# Patient Record
Sex: Male | Born: 1965 | Race: White | Hispanic: No | Marital: Married | Smoking: Former smoker
Health system: Southern US, Community
[De-identification: ages and names within clinical notes are randomized; demographics above are authoritative.]

## PROBLEM LIST (undated history)

## (undated) DIAGNOSIS — D649 Anemia, unspecified: Secondary | ICD-10-CM

## (undated) HISTORY — DX: Anemia, unspecified: D64.9

---

## 2007-12-11 ENCOUNTER — Ambulatory Visit: Payer: Self-pay | Admitting: Gastroenterology

## 2010-01-25 ENCOUNTER — Ambulatory Visit: Payer: Self-pay | Admitting: Family Medicine

## 2014-10-14 ENCOUNTER — Emergency Department: Payer: Self-pay | Admitting: Emergency Medicine

## 2014-10-14 LAB — CBC WITH DIFFERENTIAL/PLATELET
BASOS PCT: 1.3 %
Basophil #: 0.1 10*3/uL (ref 0.0–0.1)
Eosinophil #: 0.2 10*3/uL (ref 0.0–0.7)
Eosinophil %: 2.8 %
HCT: 35.6 % — ABNORMAL LOW (ref 40.0–52.0)
HGB: 10.2 g/dL — AB (ref 13.0–18.0)
LYMPHS ABS: 1.4 10*3/uL (ref 1.0–3.6)
Lymphocyte %: 15.9 %
MCH: 18 pg — AB (ref 26.0–34.0)
MCHC: 28.6 g/dL — AB (ref 32.0–36.0)
MCV: 63 fL — ABNORMAL LOW (ref 80–100)
MONO ABS: 0.6 x10 3/mm (ref 0.2–1.0)
MONOS PCT: 6.9 %
NEUTROS PCT: 73.1 %
Neutrophil #: 6.3 10*3/uL (ref 1.4–6.5)
Platelet: 278 10*3/uL (ref 150–440)
RBC: 5.63 10*6/uL (ref 4.40–5.90)
RDW: 21.2 % — ABNORMAL HIGH (ref 11.5–14.5)
WBC: 8.6 10*3/uL (ref 3.8–10.6)

## 2014-10-14 LAB — TROPONIN I

## 2014-10-15 LAB — COMPREHENSIVE METABOLIC PANEL
ALK PHOS: 55 U/L
ALT: 24 U/L
Albumin: 3.6 g/dL (ref 3.4–5.0)
Anion Gap: 8 (ref 7–16)
BILIRUBIN TOTAL: 0.6 mg/dL (ref 0.2–1.0)
BUN: 12 mg/dL (ref 7–18)
CALCIUM: 8.1 mg/dL — AB (ref 8.5–10.1)
CREATININE: 1.01 mg/dL (ref 0.60–1.30)
Chloride: 108 mmol/L — ABNORMAL HIGH (ref 98–107)
Co2: 25 mmol/L (ref 21–32)
EGFR (African American): >60
EGFR (Non-African Amer.): >60
GLUCOSE: 103 mg/dL — AB (ref 65–99)
Osmolality: 281 (ref 275–301)
Potassium: 3.8 mmol/L (ref 3.5–5.1)
SGOT(AST): 20 U/L (ref 15–37)
Sodium: 141 mmol/L (ref 136–145)
Total Protein: 7.1 g/dL (ref 6.4–8.2)

## 2014-12-30 ENCOUNTER — Ambulatory Visit: Payer: Self-pay | Admitting: Gastroenterology

## 2015-03-30 LAB — SURGICAL PATHOLOGY

## 2017-04-14 ENCOUNTER — Telehealth: Payer: Self-pay | Admitting: *Deleted

## 2017-04-14 NOTE — Telephone Encounter (Signed)
Received referral for lung screening scan. Contacted patient and reviewed criteria for being considered high risk. Patient is no longer smoking. Will contact when turns 51 years old.

## 2017-05-02 ENCOUNTER — Encounter (INDEPENDENT_AMBULATORY_CARE_PROVIDER_SITE_OTHER): Payer: Self-pay

## 2017-05-02 ENCOUNTER — Ambulatory Visit: Payer: BLUE CROSS/BLUE SHIELD

## 2017-05-02 ENCOUNTER — Inpatient Hospital Stay: Payer: BLUE CROSS/BLUE SHIELD | Attending: Oncology | Admitting: Oncology

## 2017-05-02 ENCOUNTER — Inpatient Hospital Stay: Payer: BLUE CROSS/BLUE SHIELD

## 2017-05-02 ENCOUNTER — Encounter: Payer: Self-pay | Admitting: Oncology

## 2017-05-02 VITALS — BP 134/88 | HR 65

## 2017-05-02 VITALS — BP 129/83 | HR 71 | Temp 97.4°F | Ht 68.0 in | Wt 193.0 lb

## 2017-05-02 DIAGNOSIS — D751 Secondary polycythemia: Secondary | ICD-10-CM | POA: Diagnosis not present

## 2017-05-02 DIAGNOSIS — R5381 Other malaise: Secondary | ICD-10-CM | POA: Insufficient documentation

## 2017-05-02 DIAGNOSIS — Z801 Family history of malignant neoplasm of trachea, bronchus and lung: Secondary | ICD-10-CM | POA: Diagnosis not present

## 2017-05-02 DIAGNOSIS — Z87891 Personal history of nicotine dependence: Secondary | ICD-10-CM | POA: Insufficient documentation

## 2017-05-02 DIAGNOSIS — E291 Testicular hypofunction: Secondary | ICD-10-CM | POA: Diagnosis not present

## 2017-05-02 DIAGNOSIS — Z79899 Other long term (current) drug therapy: Secondary | ICD-10-CM

## 2017-05-02 DIAGNOSIS — R5383 Other fatigue: Secondary | ICD-10-CM | POA: Diagnosis not present

## 2017-05-02 LAB — COMPREHENSIVE METABOLIC PANEL
ALBUMIN: 4.3 g/dL (ref 3.5–5.0)
ALK PHOS: 54 U/L (ref 38–126)
ALT: 29 U/L (ref 17–63)
AST: 23 U/L (ref 15–41)
Anion gap: 6 (ref 5–15)
BUN: 19 mg/dL (ref 6–20)
CALCIUM: 9.5 mg/dL (ref 8.9–10.3)
CO2: 24 mmol/L (ref 22–32)
CREATININE: 0.99 mg/dL (ref 0.61–1.24)
Chloride: 105 mmol/L (ref 101–111)
GFR calc Af Amer: >60 mL/min (ref >60)
GFR calc non Af Amer: >60 mL/min (ref >60)
GLUCOSE: 100 mg/dL — AB (ref 65–99)
Potassium: 4.2 mmol/L (ref 3.5–5.1)
SODIUM: 135 mmol/L (ref 135–145)
Total Bilirubin: 1.2 mg/dL (ref 0.3–1.2)
Total Protein: 7.2 g/dL (ref 6.5–8.1)

## 2017-05-02 LAB — CBC WITH DIFFERENTIAL/PLATELET
BASOS PCT: 0 %
Basophils Absolute: 0 10*3/uL (ref 0–0.1)
EOS ABS: 0.3 10*3/uL (ref 0–0.7)
Eosinophils Relative: 4 %
HEMATOCRIT: 50.1 % (ref 40.0–52.0)
HEMOGLOBIN: 17.6 g/dL (ref 13.0–18.0)
LYMPHS ABS: 1.6 10*3/uL (ref 1.0–3.6)
Lymphocytes Relative: 19 %
MCH: 32.7 pg (ref 26.0–34.0)
MCHC: 35.2 g/dL (ref 32.0–36.0)
MCV: 92.9 fL (ref 80.0–100.0)
Monocytes Absolute: 0.5 10*3/uL (ref 0.2–1.0)
Monocytes Relative: 7 %
NEUTROS ABS: 5.7 10*3/uL (ref 1.4–6.5)
NEUTROS PCT: 70 %
Platelets: 248 10*3/uL (ref 150–440)
RBC: 5.39 MIL/uL (ref 4.40–5.90)
RDW: 12 % (ref 11.5–14.5)
WBC: 8.2 10*3/uL (ref 3.8–10.6)

## 2017-05-02 LAB — URINALYSIS, COMPLETE (UACMP) WITH MICROSCOPIC
BACTERIA UA: NONE SEEN
Bilirubin Urine: NEGATIVE
Glucose, UA: NEGATIVE mg/dL
Hgb urine dipstick: NEGATIVE
Ketones, ur: NEGATIVE mg/dL
Leukocytes, UA: NEGATIVE
Nitrite: NEGATIVE
PH: 7 (ref 5.0–8.0)
Protein, ur: NEGATIVE mg/dL
SPECIFIC GRAVITY, URINE: 1.02 (ref 1.005–1.030)
SQUAMOUS EPITHELIAL / LPF: NONE SEEN

## 2017-05-02 NOTE — Progress Notes (Addendum)
Hematology/Oncology Consult note [facility] [phone] [phone]  Patient Care Team: Dortha KernBliss, Laura K, MD as PCP - General (Family Medicine)   Name of the patient: Mark Riddle  [id number]  01/18/1966    Reason for referral- polycythemia   Referring physician- Dr. Joen LauraLaura Bliss  Date of visit: 05/02/17   History of presenting illness- patient is a 51 year old male with a history of hypogonadism who has been on testosterone since last 2-3 years. Currently he is off testosterone for 1.5 months.  He has been referred to us for elevated hemoglobin. Most recent labs from 04/13/2017 showed a mildly elevated total bilirubin of 1.7 with normal LFTs. BMP were within normal limits. CBC showed white count of 6.5, H&H of 19.5/50.4 and a platelet count of 219. Iron studies showed B12 of 1068. Folate was greater than 20. Iron saturation was 47% and serum iron was normal at 153. Prior CBC from May 2016 showed H&H of 17.7/51.7 in December 2015 his H&H was 12.2/41.7 with an MCV of 72   The patient smoked for 30 years but quit smoking about 10 years ago. He has not been evaluated for obstructive sleep apnea. He does report a restful sleep but wakes up once or twice a night to go to the bathroom. Denies any loss of appetite or unintentional weight loss   ECOG PS- 0  Pain scale- 0   Review of systems- Review of Systems  Constitutional: Positive for malaise/fatigue. Negative for chills, fever and weight loss.  HENT: Negative for congestion, ear discharge and nosebleeds.   Eyes: Negative for blurred vision.  Respiratory: Negative for cough, hemoptysis, sputum production, shortness of breath and wheezing.   Cardiovascular: Negative for chest pain, palpitations, orthopnea and claudication.  Gastrointestinal: Negative for abdominal pain, blood in stool, constipation, diarrhea, heartburn, melena, nausea and vomiting.  Genitourinary: Negative for dysuria, flank pain,  frequency, hematuria and urgency.  Musculoskeletal: Negative for back pain, joint pain and myalgias.  Skin: Negative for rash.  Neurological: Negative for dizziness, tingling, focal weakness, seizures, weakness and headaches.  Endo/Heme/Allergies: Does not bruise/bleed easily.  Psychiatric/Behavioral: Negative for depression and suicidal ideas. The patient does not have insomnia.     No Known Allergies  There are no active problems to display for this patient.    Past Medical History:  Diagnosis Date  . Anemia      History reviewed. No pertinent surgical history.  Social History   Social History  . Marital status: Married    Spouse name: N/A  . Number of children: N/A  . Years of education: N/A   Occupational History  . Not on file.   Social History Main Topics  . Smoking status: Former Smoker    Packs/day: 1.00    Quit date: 05/03/2007  . Smokeless tobacco: Never Used  . Alcohol use Yes     Comment: occasional  . Drug use: No  . Sexual activity: Not on file   Other Topics Concern  . Not on file   Social History Narrative  . No narrative on file     Family History  Problem Relation Age of Onset  . Pulmonary fibrosis Maternal Uncle   . Pulmonary fibrosis Paternal Aunt   . Diabetes Paternal Grandmother   . Cancer Paternal Grandfather        lung     Current Outpatient Prescriptions:  .  omeprazole (PRILOSEC) 40 MG capsule, Take by mouth., Disp: , Rfl:    Physical exam:  Vitals:   05/02/17 1154  BP: 129/83  Pulse: 71  Temp: 97.4 F (36.3 C)  TempSrc: Tympanic  Weight: 193 lb (87.5 kg)  Height: 5\' 8"  (1.727 m)   Physical Exam  Constitutional: He is oriented to person, place, and time and well-developed, well-nourished, and in no distress.  Face appears plethoric  HENT:  Head: Normocephalic and atraumatic.  Eyes: EOM are normal. Pupils are equal, round, and reactive to light.  Neck: Normal range of motion.  Cardiovascular: Normal rate,  regular rhythm and normal heart sounds.   Pulmonary/Chest: Effort normal and breath sounds normal.  Abdominal: Soft. Bowel sounds are normal.  No palpable splenomegaly  Neurological: He is alert and oriented to person, place, and time.  Skin: Skin is warm and dry.       CMP Latest Ref Rng & Units 10/15/2014  Glucose 65 - 99 mg/dL 161(W)  BUN 7 - 18 mg/dL 12  Creatinine 9.60 - 4.54 mg/dL 0.98  Sodium 119 - 147 mmol/L 141  Potassium 3.5 - 5.1 mmol/L 3.8  Chloride 98 - 107 mmol/L 108(H)  CO2 21 - 32 mmol/L 25  Calcium 8.5 - 10.1 mg/dL 8.1(L)  Total Protein 6.4 - 8.2 g/dL 7.1  Total Bilirubin 0.2 - 1.0 mg/dL 0.6  Alkaline Phos Unit/L 55  AST 15 - 37 Unit/L 20  ALT U/L 24   CBC Latest Ref Rng & Units 05/02/2017  WBC 3.8 - 10.6 K/uL 8.2  Hemoglobin 13.0 - 18.0 g/dL 82.9  Hematocrit 56.2 - 52.0 % 50.1  Platelets 150 - 440 K/uL 248     Assessment and plan- Patient is a 51 y.o. male referred to [place] for polycythemia  Polycythemia- explained what is primary versus secondary polycythemia. 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 I would like to r/o primary polycythemia vera however. Testosterone induced polycythemia resolves after stopping testosterone supplements. I would recommend that if patient needs testosterone, he needs to eb started on the lowest possible dose only to keep his hematocriit and hemoglobin within normal range  Today I will check cbc with diff, epo level, jak2 testing with reflex to exon 12, CXR, UA and serum testosetrone level. Will arrange for phlebotomy today and Qweekly until hematocrit <50 until we ascertain the cause.   I will see him back in 4 weeks time  Thank you for this kind referral and the opportunity to participate in the care of this patient   Visit Diagnosis 1. Polycythemia, secondary     Dr. Owens Shark, MD, MPH [facility] at [facility] Pager-  [phone] 05/02/2017    Addendum: repeat cbc today showed hb of 17.1 hct 50.1. He can go for phlebotomy today but we will hold off on future phlebotomies  Dr. Owens Shark, MD, MPH [facility] at [facility] Pager- [phone] 05/02/2017 12:50 PM

## 2017-05-02 NOTE — Patient Instructions (Signed)

## 2017-05-02 NOTE — Progress Notes (Signed)
Patient here for initial visit. He has no significant medica/ surgical history. No complaints of pain or discomfort, appetite good, sleep pattern normal. Patient was recently taken off testosterone injections due to elevated HGB.

## 2017-05-03 LAB — TESTOSTERONE: Testosterone: 191 ng/dL — ABNORMAL LOW (ref 264–916)

## 2017-05-08 LAB — JAK2 EXONS 12-15

## 2017-05-09 ENCOUNTER — Inpatient Hospital Stay: Payer: BLUE CROSS/BLUE SHIELD | Attending: Oncology

## 2017-05-09 ENCOUNTER — Inpatient Hospital Stay: Payer: BLUE CROSS/BLUE SHIELD

## 2017-05-09 DIAGNOSIS — E291 Testicular hypofunction: Secondary | ICD-10-CM | POA: Insufficient documentation

## 2017-05-09 DIAGNOSIS — D751 Secondary polycythemia: Secondary | ICD-10-CM | POA: Insufficient documentation

## 2017-05-09 DIAGNOSIS — R5383 Other fatigue: Secondary | ICD-10-CM | POA: Insufficient documentation

## 2017-05-09 DIAGNOSIS — Z87891 Personal history of nicotine dependence: Secondary | ICD-10-CM | POA: Insufficient documentation

## 2017-05-09 DIAGNOSIS — R531 Weakness: Secondary | ICD-10-CM | POA: Insufficient documentation

## 2017-05-16 ENCOUNTER — Inpatient Hospital Stay: Payer: BLUE CROSS/BLUE SHIELD

## 2017-05-22 ENCOUNTER — Ambulatory Visit
Admission: RE | Admit: 2017-05-22 | Discharge: 2017-05-22 | Disposition: A | Discharge status: Home / Self Care | Payer: BLUE CROSS/BLUE SHIELD | Source: Ambulatory Visit | Attending: Oncology | Admitting: Oncology

## 2017-05-22 DIAGNOSIS — D751 Secondary polycythemia: Secondary | ICD-10-CM

## 2017-05-23 ENCOUNTER — Inpatient Hospital Stay: Payer: BLUE CROSS/BLUE SHIELD

## 2017-05-29 ENCOUNTER — Other Ambulatory Visit: Payer: Self-pay | Admitting: *Deleted

## 2017-05-29 DIAGNOSIS — D751 Secondary polycythemia: Secondary | ICD-10-CM

## 2017-05-30 ENCOUNTER — Other Ambulatory Visit: Payer: Self-pay | Admitting: *Deleted

## 2017-05-30 ENCOUNTER — Inpatient Hospital Stay (HOSPITAL_BASED_OUTPATIENT_CLINIC_OR_DEPARTMENT_OTHER): Payer: BLUE CROSS/BLUE SHIELD | Admitting: Oncology

## 2017-05-30 ENCOUNTER — Inpatient Hospital Stay: Payer: BLUE CROSS/BLUE SHIELD

## 2017-05-30 ENCOUNTER — Telehealth: Payer: Self-pay | Admitting: *Deleted

## 2017-05-30 ENCOUNTER — Encounter: Payer: Self-pay | Admitting: Oncology

## 2017-05-30 ENCOUNTER — Other Ambulatory Visit: Payer: Self-pay | Admitting: Oncology

## 2017-05-30 VITALS — BP 145/71 | HR 71 | Temp 97.5°F | Resp 18 | Wt 194.4 lb

## 2017-05-30 DIAGNOSIS — Z87891 Personal history of nicotine dependence: Secondary | ICD-10-CM

## 2017-05-30 DIAGNOSIS — D751 Secondary polycythemia: Secondary | ICD-10-CM

## 2017-05-30 DIAGNOSIS — E291 Testicular hypofunction: Secondary | ICD-10-CM

## 2017-05-30 DIAGNOSIS — R5383 Other fatigue: Secondary | ICD-10-CM

## 2017-05-30 DIAGNOSIS — R531 Weakness: Secondary | ICD-10-CM | POA: Diagnosis not present

## 2017-05-30 LAB — CBC
HEMATOCRIT: 45.4 % (ref 40.0–52.0)
HEMOGLOBIN: 16.1 g/dL (ref 13.0–18.0)
MCH: 32.8 pg (ref 26.0–34.0)
MCHC: 35.5 g/dL (ref 32.0–36.0)
MCV: 92.5 fL (ref 80.0–100.0)
Platelets: 210 10*3/uL (ref 150–440)
RBC: 4.9 MIL/uL (ref 4.40–5.90)
RDW: 12.2 % (ref 11.5–14.5)
WBC: 7.7 10*3/uL (ref 3.8–10.6)

## 2017-05-30 NOTE — Telephone Encounter (Signed)
Called the practice and was explaining the pt's hct is below 50 but it is due to him not being on testosterone.  We will see him back in 3months.  Dr. Smith Robertao was told by pt that he will probably be starting back on testosterone low dose soon.  Since we are not seeing him for 3 months could they check a monthly cbc if they start him back on testosterone only. If the cbc results show hct over 50 then call us and we can set him up for phlebotomy.  The office wanted me to send this in a fax and I have sent it with why and when and what we need to be called about

## 2017-05-30 NOTE — Progress Notes (Signed)
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 Currently he is off testosterone for 1.5 months.  He has been referred to us for elevated hemoglobin. Most recent labs from 04/13/2017 showed a mildly elevated total bilirubin of 1.7 with normal LFTs. BMP were within normal limits. CBC showed white count of 6.5, H&H of 19.5/50.4 and a platelet count of 219. Iron studies showed B12 of 1068. Folate was greater than 20. Iron saturation was 47% and serum iron was normal at 153. Prior CBC from May 2016 showed H&H of 17.7/51.7 in December 2015 his H&H was 12.2/41.7 with an MCV of 72   The patient smoked for 30 years but quit smoking about 10 years ago. He has not been evaluated for obstructive sleep apnea. He does report a restful sleep but wakes up once or twice a night to go to the bathroom. Denies any loss of appetite or unintentional weight loss   Further blood work from 05/02/2017 was as follows: CBC showed white count of 8.2, H&H of 17.6/50.1 and a platelet count of 248. CMP was within normal limits. Jak 2 exon 12 mutation testing was negative. Urinalysis was negative for hematuria.   Interval history- Patient remains off testosterone and does report fatigue because of that  ECOG PS- 1 Pain scale- 0   Review of systems- Review of Systems  Constitutional: Positive for malaise/fatigue. Negative for chills, fever and weight loss.  HENT: Negative for congestion, ear discharge and nosebleeds.   Eyes: Negative for  blurred vision.  Respiratory: Negative for cough, hemoptysis, sputum production, shortness of breath and wheezing.   Cardiovascular: Negative for chest pain, palpitations, orthopnea and claudication.  Gastrointestinal: Negative for abdominal pain, blood in stool, constipation, diarrhea, heartburn, melena, nausea and vomiting.  Genitourinary: Negative for dysuria, flank pain, frequency, hematuria and urgency.  Musculoskeletal: Negative for back pain, joint pain and myalgias.  Skin: Negative for rash.  Neurological: Negative for dizziness, tingling, focal weakness, seizures, weakness and headaches.  Endo/Heme/Allergies: Does not bruise/bleed easily.  Psychiatric/Behavioral: Negative for depression and suicidal ideas. The patient does not have insomnia.        No Known Allergies   Past Medical History:  Diagnosis Date  . Anemia      No past surgical history on file.  Social History   Social History  . Marital status: Married    Spouse name: N/A  . Number of children: N/A  . Years of education: N/A   Occupational History  . Not on file.   Social History Main Topics  . Smoking status: Former Smoker    Packs/day: 1.00    Quit date: 05/03/2007  . Smokeless tobacco: Never Used  . Alcohol use Yes     Comment: occasional  . Drug use: No  . Sexual activity: Not on file   Other Topics Concern  . Not on file   Social History Narrative  . No narrative on file    Family History  Problem Relation Age of Onset  . Pulmonary fibrosis Maternal Uncle   . Pulmonary  fibrosis Paternal Aunt   . Diabetes Paternal Grandmother   . Cancer Paternal Grandfather        lung     Current Outpatient Prescriptions:  .  omeprazole (PRILOSEC) 40 MG capsule, Take by mouth., Disp: , Rfl:   Physical exam:  Vitals:   05/30/17 1417  BP: (!) 145/71  Pulse: 71  Resp: 18  Temp: 97.5 F (36.4 C)  TempSrc: Tympanic  Weight: 194 lb 7 oz (88.2 kg)   Physical Exam  Constitutional: He is  oriented to person, place, and time and well-developed, well-nourished, and in no distress.  HENT:  Head: Normocephalic and atraumatic.  Eyes: EOM are normal. Pupils are equal, round, and reactive to light.  Neck: Normal range of motion.  Cardiovascular: Normal rate, regular rhythm and normal heart sounds.   Pulmonary/Chest: Effort normal and breath sounds normal.  Abdominal: Soft. Bowel sounds are normal.  Neurological: He is alert and oriented to person, place, and time.  Skin: Skin is warm and dry.     CMP Latest Ref Rng & Units 05/02/2017  Glucose 65 - 99 mg/dL 161(W)  BUN 6 - 20 mg/dL 19  Creatinine 9.60 - 4.54 mg/dL 0.98  Sodium 119 - 147 mmol/L 135  Potassium 3.5 - 5.1 mmol/L 4.2  Chloride 101 - 111 mmol/L 105  CO2 22 - 32 mmol/L 24  Calcium 8.9 - 10.3 mg/dL 9.5  Total Protein 6.5 - 8.1 g/dL 7.2  Total Bilirubin 0.3 - 1.2 mg/dL 1.2  Alkaline Phos 38 - 126 U/L 54  AST 15 - 41 U/L 23  ALT 17 - 63 U/L 29   CBC Latest Ref Rng & Units 05/30/2017  WBC 3.8 - 10.6 K/uL 7.7  Hemoglobin 13.0 - 18.0 g/dL 82.9  Hematocrit 56.2 - 52.0 % 45.4  Platelets 150 - 440 K/uL 210    No images are attached to the encounter.  Dg Chest 2 View  Result Date: 05/22/2017 CLINICAL DATA:  Hypogonadism with elevated hemoglobin EXAM: CHEST  2 VIEW COMPARISON:  None. FINDINGS: The heart size and mediastinal contours are within normal limits. Both lungs are clear. The visualized skeletal structures are unremarkable. IMPRESSION: No active cardiopulmonary disease. Electronically Signed   By: Alcide Clever M.D.   On: 05/22/2017 16:01     Assessment and plan- Patient is a 51 y.o. male with secondary polycythemia likely secondary to testosterone replacement therapy  Patient received one session of phlebotomy during his last visit. Currently his H&H of 16.1/45.4 and he does not need a phlebotomy at this time. He has been off testosterone for about 2 months now. He can be restarted on testosterone at the  lowest possible dose which will alleviate his symptoms. Dr. Quillian Quince who has been giving him his testosterone would also need to check periodic CBCs every month and should his hematocrit is 50, our office needs to be contacted to restart phlebotomy at that time    I will see him back in 3 months with repeat cbc diff for possible phlebotomy  Visit Diagnosis 1. Polycythemia, secondary      Dr. Owens Shark, MD, MPH [facility] Pager- [phone] 05/30/2017 3:13 PM

## 2017-05-30 NOTE — Progress Notes (Signed)
Patient offers no complaints today. 

## 2017-08-31 ENCOUNTER — Inpatient Hospital Stay: Payer: BLUE CROSS/BLUE SHIELD

## 2017-08-31 ENCOUNTER — Other Ambulatory Visit: Payer: Self-pay | Admitting: *Deleted

## 2017-08-31 ENCOUNTER — Encounter: Payer: Self-pay | Admitting: Oncology

## 2017-08-31 ENCOUNTER — Inpatient Hospital Stay: Payer: BLUE CROSS/BLUE SHIELD | Attending: Oncology | Admitting: Oncology

## 2017-08-31 VITALS — BP 123/79 | HR 68 | Temp 98.7°F | Resp 14 | Ht 68.0 in | Wt 196.6 lb

## 2017-08-31 DIAGNOSIS — E291 Testicular hypofunction: Secondary | ICD-10-CM | POA: Diagnosis not present

## 2017-08-31 DIAGNOSIS — Z79899 Other long term (current) drug therapy: Secondary | ICD-10-CM | POA: Insufficient documentation

## 2017-08-31 DIAGNOSIS — R5381 Other malaise: Secondary | ICD-10-CM | POA: Insufficient documentation

## 2017-08-31 DIAGNOSIS — D751 Secondary polycythemia: Secondary | ICD-10-CM | POA: Diagnosis not present

## 2017-08-31 DIAGNOSIS — R5383 Other fatigue: Secondary | ICD-10-CM | POA: Diagnosis not present

## 2017-08-31 DIAGNOSIS — Z87891 Personal history of nicotine dependence: Secondary | ICD-10-CM

## 2017-08-31 DIAGNOSIS — R6882 Decreased libido: Secondary | ICD-10-CM | POA: Insufficient documentation

## 2017-08-31 LAB — CBC
HEMATOCRIT: 45.4 % (ref 40.0–52.0)
Hemoglobin: 16 g/dL (ref 13.0–18.0)
MCH: 33.6 pg (ref 26.0–34.0)
MCHC: 35.3 g/dL (ref 32.0–36.0)
MCV: 95.2 fL (ref 80.0–100.0)
PLATELETS: 222 10*3/uL (ref 150–440)
RBC: 4.77 MIL/uL (ref 4.40–5.90)
RDW: 11.9 % (ref 11.5–14.5)
WBC: 8.8 10*3/uL (ref 3.8–10.6)

## 2017-08-31 NOTE — Progress Notes (Signed)
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 Currently he is off testosterone for 1.5 months. He has been referred to [place] for elevated hemoglobin. Most recent labs from 04/13/2017 showed a mildly elevated total bilirubin of 1.7 with normal LFTs. BMPwere within normal limits. CBC showed white count of 6.5, H&H of 19.5/50.4 and a platelet count of 219. Iron studies showed B12 of 1068. Folate was greater than 20. Iron saturation was 47% and serum iron was normal at 153. Prior CBC from May 2016 showed H&H of 17.7/51.7 in December 2015 his H&H was 12.2/41.7 with an MCV of 72   The patient smoked for 30 years but quit smoking about 10 years ago. He has not been evaluated for obstructive sleep apnea. He does report a restful sleep but wakes up once or twice a night to go to the bathroom. Denies any loss of appetite or unintentional weight loss   Further blood work from 05/02/2017 was as follows: CBC showed white count of 8.2, H&H of 17.6/50.1 and a platelet count of 248. CMP was within normal limits. Jak 2 exon 12 mutation testing was negative. Urinalysis was negative for hematuria.  Interval history- reports feeling fatigued and low in energy and sex drive has been low  ECOG PS- 0 Pain scale- 0   Review of systems- Review of Systems  Constitutional: Positive for malaise/fatigue. Negative for chills, fever and weight loss.  HENT: Negative for congestion, ear discharge  and nosebleeds.   Eyes: Negative for blurred vision.  Respiratory: Negative for cough, hemoptysis, sputum production, shortness of breath and wheezing.   Cardiovascular: Negative for chest pain, palpitations, orthopnea and claudication.  Gastrointestinal: Negative for abdominal pain, blood in stool, constipation, diarrhea, heartburn, melena, nausea and vomiting.  Genitourinary: Negative for dysuria, flank pain, frequency, hematuria and urgency.  Musculoskeletal: Negative for back pain, joint pain and myalgias.  Skin: Negative for rash.  Neurological: Negative for dizziness, tingling, focal weakness, seizures, weakness and headaches.  Endo/Heme/Allergies: Does not bruise/bleed easily.  Psychiatric/Behavioral: Negative for depression and suicidal ideas. The patient does not have insomnia.      No Known Allergies   Past Medical History:  Diagnosis Date  . Anemia      No past surgical history on file.  Social History   Social History  . Marital status: Married    Spouse name: N/A  . Number of children: N/A  . Years of education: N/A   Occupational History  . Not on file.   Social History Main Topics  . Smoking status: Former Smoker    Packs/day: 1.00    Quit date: 05/03/2007  . Smokeless tobacco: Never Used  . Alcohol use Yes     Comment: occasional  . Drug use: No  . Sexual activity: Not on file   Other Topics Concern  . Not on file   Social History Narrative  . No narrative on file    Family History  Problem Relation Age of Onset  . Pulmonary fibrosis Maternal Uncle   .  Pulmonary fibrosis Paternal Aunt   . Diabetes Paternal Grandmother   . Cancer Paternal Grandfather        lung     Current Outpatient Prescriptions:  .  omeprazole (PRILOSEC) 40 MG capsule, Take by mouth., Disp: , Rfl:   Physical exam:  Vitals:   08/31/17 1420  BP: 123/79  Pulse: 68  Resp: 14  Temp: 98.7 F (37.1 C)  TempSrc: Tympanic  Weight: 196 lb 9.6 oz (89.2 kg)  Height: 5'  8" (1.727 m)   Physical Exam  Constitutional: He is oriented to person, place, and time and well-developed, well-nourished, and in no distress.  HENT:  Head: Normocephalic and atraumatic.  Eyes: Pupils are equal, round, and reactive to light. EOM are normal.  Neck: Normal range of motion.  Cardiovascular: Normal rate, regular rhythm and normal heart sounds.   Pulmonary/Chest: Effort normal and breath sounds normal.  Abdominal: Soft. Bowel sounds are normal.  Neurological: He is alert and oriented to person, place, and time.  Skin: Skin is warm and dry.     CMP Latest Ref Rng & Units 05/02/2017  Glucose 65 - 99 mg/dL 045(W)  BUN 6 - 20 mg/dL 19  Creatinine 0.98 - 1.19 mg/dL 1.47  Sodium 829 - 562 mmol/L 135  Potassium 3.5 - 5.1 mmol/L 4.2  Chloride 101 - 111 mmol/L 105  CO2 22 - 32 mmol/L 24  Calcium 8.9 - 10.3 mg/dL 9.5  Total Protein 6.5 - 8.1 g/dL 7.2  Total Bilirubin 0.3 - 1.2 mg/dL 1.2  Alkaline Phos 38 - 126 U/L 54  AST 15 - 41 U/L 23  ALT 17 - 63 U/L 29   CBC Latest Ref Rng & Units 08/31/2017  WBC 3.8 - 10.6 K/uL 8.8  Hemoglobin 13.0 - 18.0 g/dL 13.0  Hematocrit 86.5 - 52.0 % 45.4  Platelets 150 - 440 K/uL 222     Assessment and plan- Patient is a 51 y.o. 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 He is no longer polycythemic and does not require phelebotomy today. He reports more energy levels and better sex drive when he was on testosterone. I would therefore recomemnd that he can be restarted on testosetrone at a low dose and dose can be titrated slowly upwards to symptom relief while closely monitoring cbc monthly. If he has polycythemia with low doses of testosterone , I will consider phlebotomy at that time. JAK2 mutation testing from today is pending  RTC in 3 months with cbc diff for possible phlebotomy   Visit Diagnosis 1. Polycythemia, secondary      Dr. Owens Shark, MD, MPH [facility] Pager- [phone] 08/31/2017 2:20 PM

## 2017-08-31 NOTE — Progress Notes (Signed)
Patient has no complaints at this time.  

## 2017-09-04 LAB — JAK2 GENOTYPR

## 2017-09-07 ENCOUNTER — Emergency Department: Payer: Worker's Compensation

## 2017-09-07 ENCOUNTER — Emergency Department
Admission: EM | Admit: 2017-09-07 | Discharge: 2017-09-07 | Disposition: A | Discharge status: Home / Self Care | Payer: Worker's Compensation | Attending: Emergency Medicine | Admitting: Emergency Medicine

## 2017-09-07 ENCOUNTER — Encounter: Payer: Self-pay | Admitting: Emergency Medicine

## 2017-09-07 DIAGNOSIS — W319XXA Contact with unspecified machinery, initial encounter: Secondary | ICD-10-CM | POA: Insufficient documentation

## 2017-09-07 DIAGNOSIS — M20012 Mallet finger of left finger(s): Secondary | ICD-10-CM | POA: Diagnosis not present

## 2017-09-07 DIAGNOSIS — Y929 Unspecified place or not applicable: Secondary | ICD-10-CM | POA: Insufficient documentation

## 2017-09-07 DIAGNOSIS — Y990 Civilian activity done for income or pay: Secondary | ICD-10-CM | POA: Insufficient documentation

## 2017-09-07 DIAGNOSIS — Z79899 Other long term (current) drug therapy: Secondary | ICD-10-CM | POA: Insufficient documentation

## 2017-09-07 DIAGNOSIS — S92535A Nondisplaced fracture of distal phalanx of left lesser toe(s), initial encounter for closed fracture: Secondary | ICD-10-CM | POA: Insufficient documentation

## 2017-09-07 DIAGNOSIS — Z87891 Personal history of nicotine dependence: Secondary | ICD-10-CM | POA: Diagnosis not present

## 2017-09-07 DIAGNOSIS — Y9389 Activity, other specified: Secondary | ICD-10-CM | POA: Insufficient documentation

## 2017-09-07 DIAGNOSIS — S6992XA Unspecified injury of left wrist, hand and finger(s), initial encounter: Secondary | ICD-10-CM | POA: Diagnosis present

## 2017-09-07 DIAGNOSIS — S62639A Displaced fracture of distal phalanx of unspecified finger, initial encounter for closed fracture: Secondary | ICD-10-CM

## 2017-09-07 NOTE — ED Notes (Signed)
Radiology at bedside for x ray

## 2017-09-07 NOTE — ED Notes (Signed)
Pt was at work and got his left tip of his 5th digit in some selfing at work. He states that he has no pain at this time that it is numb. No laceration. Tip of 5th digit is slightly swollen. No deformity seen.

## 2017-09-07 NOTE — ED Notes (Signed)
Applied foam splint to left 5th digit. Secured with stretchy tape. Pulses present.

## 2017-09-07 NOTE — ED Provider Notes (Signed)
[facility] Emergency Department Provider Note  ____________________________________________  Time seen: Approximately 7:34 AM  I have reviewed the triage vital signs and the nursing notes.   HISTORY  Chief Complaint Finger Injury    HPI Mark Riddle is a 51 y.o. male and presents to emergency department for evaluation of left little finger injury. Patient states that he got his hand caught in a machine at work. He is having difficulty straightening finger. He is able to bend his finger without difficulty. No additional injuries.No numbness, tingling.   Past Medical History:  Diagnosis Date  . Anemia     Patient Active Problem List   Diagnosis Date Noted  . 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 No pertinent surgical history.  Prior to Admission medications   Medication Sig Start Date End Date Taking? Authorizing Provider  omeprazole (PRILOSEC) 40 MG capsule Take 40 mg by mouth daily.   Yes [provider]  QUEtiapine (SEROQUEL) 50 MG tablet Take 50 mg by mouth at bedtime.   Yes [provider]  omeprazole (PRILOSEC) 40 MG capsule Take by mouth.    [provider]    Allergies Patient has no known allergies.  Family History  Problem Relation Age of Onset  . Pulmonary fibrosis Maternal Uncle   . Pulmonary fibrosis Paternal Aunt   . Diabetes Paternal Grandmother   . Cancer Paternal Grandfather        lung    Social History Social History  Substance Use Topics  . Smoking status: Former Smoker    Packs/day: 1.00    Quit date: 05/03/2007  . Smokeless tobacco: Never Used  . Alcohol use Yes     Comment: occasional     Review of Systems  Constitutional: No fever/chills Cardiovascular: No chest pain. Respiratory: No SOB. Gastrointestinal: No abdominal pain.  No nausea, no vomiting.  Skin: Negative for rash, abrasions, lacerations,  ecchymosis.   ____________________________________________   PHYSICAL EXAM:  VITAL SIGNS: ED Triage Vitals  Enc Vitals Group     BP 09/07/17 0717 (!) 146/86     Pulse Rate 09/07/17 0717 76     Resp --      Temp 09/07/17 0717 98.1 F (36.7 C)     Temp Source 09/07/17 0717 Oral     SpO2 09/07/17 0717 94 %     Weight 09/07/17 0718 198 lb (89.8 kg)     Height 09/07/17 0718  (1.727 m)     Head Circumference --      Peak Flow --      Pain Score --      Pain Loc --      Pain Edu? --      Excl. in GC? --      Constitutional: Alert and oriented. Well appearing and in no acute distress. Eyes: Conjunctivae are normal. PERRL. EOMI. Head: Atraumatic. ENT:      Ears:      Nose: No congestion/rhinnorhea.      Mouth/Throat: Mucous membranes are moist.  Neck: No stridor.  Cardiovascular: Normal rate, regular rhythm.  Good peripheral circulation. Respiratory: Normal respiratory effort without tachypnea or retractions. Lungs CTAB. Good air entry to the bases with no decreased or absent breath sounds. Musculoskeletal: Full range of motion to all extremities. No gross deformities appreciated. Unable to achieve full passive extension at DIP joint of little left finger. Full strength with flexion of the left finger. Mild swelling of left fifth finger. Neurologic:  Normal  speech and language. No gross focal neurologic deficits are appreciated.  Skin:  Skin is warm, dry and intact. No rash noted.   ____________________________________________   LABS (all labs ordered are listed, but only abnormal results are displayed)  Labs Reviewed - No data to display ____________________________________________  EKG   ____________________________________________  RADIOLOGY Lexine Baton, personally viewed and evaluated these images (plain radiographs) as part of my medical decision making, as well as reviewing the written report by the radiologist.  Dg Finger Little Left  Result Date:  09/07/2017 CLINICAL DATA:  Crush injury involving the pinky finger. No previous injury. EXAM: LEFT LITTLE FINGER 2+V COMPARISON:  None. FINDINGS: Potential minimally displaced fracture involving the medial aspect of the tuft of the distal phalanx of the fifth digit. No definite intra-articular extension. Joint spaces appear preserved. No erosions. Regional soft tissues appear normal. No radiopaque foreign body. IMPRESSION: Potential tiny minimally displaced fracture involving medial aspect of the tuft of the distal phalanx of the fifth digit without intra-articular extension or radiopaque foreign body. Electronically Signed   By: Simonne Come M.D.   On: 09/07/2017 08:39    ____________________________________________    PROCEDURES  Procedure(s) performed:    Procedures    Medications - No data to display   ____________________________________________   INITIAL IMPRESSION / ASSESSMENT AND PLAN / ED COURSE  Pertinent labs & imaging results that were available during my care of the patient were reviewed by me and considered in my medical decision making (see chart for details).  Review of the [place] CSRS was performed in accordance of the [facility] prior to dispensing any controlled drugs.     Patient's diagnosis is consistent with mallet finger. Vital signs and exam are reassuring. X-ray indicates possibly minimally displaced fracture at distal phalanx. Up-to-date recommends that patient be placed in a splint in extension and follow-up with orthopedics. Splint was placed. Patient is to follow up with orthopedics as directed. Patient is given ED precautions to return to the ED for any worsening or new symptoms.     ____________________________________________  FINAL CLINICAL IMPRESSION(S) / ED DIAGNOSES  Final diagnoses:  Mallet finger of left hand  Closed fracture of tuft of distal phalanx of finger      NEW MEDICATIONS STARTED DURING THIS VISIT:  Discharge Medication List as of  09/07/2017  9:09 AM          This chart was dictated using voice recognition software/Dragon. Despite best efforts to proofread, errors can occur which can change the meaning. Any change was purely unintentional.    Enid Derry, PA-C 09/07/17 1037    Governor Rooks, MD 09/07/17 1328

## 2017-09-07 NOTE — ED Triage Notes (Signed)
Patient presents to ED via POV from work. Patient injured his left pinky finger. Patient states he got it caught between a metal crate at work and twisted it. Edema noted. Patient wishes to file WC. UDS required per WC profile.

## 2017-11-30 ENCOUNTER — Inpatient Hospital Stay: Payer: BLUE CROSS/BLUE SHIELD

## 2017-11-30 ENCOUNTER — Encounter: Payer: Self-pay | Admitting: Oncology

## 2017-11-30 ENCOUNTER — Inpatient Hospital Stay: Payer: BLUE CROSS/BLUE SHIELD | Attending: Oncology | Admitting: Oncology

## 2017-11-30 VITALS — BP 141/95 | HR 81 | Temp 97.7°F | Resp 16 | Wt 208.0 lb

## 2017-11-30 DIAGNOSIS — Z79899 Other long term (current) drug therapy: Secondary | ICD-10-CM | POA: Diagnosis not present

## 2017-11-30 DIAGNOSIS — Z87891 Personal history of nicotine dependence: Secondary | ICD-10-CM | POA: Insufficient documentation

## 2017-11-30 DIAGNOSIS — Z809 Family history of malignant neoplasm, unspecified: Secondary | ICD-10-CM | POA: Insufficient documentation

## 2017-11-30 DIAGNOSIS — E291 Testicular hypofunction: Secondary | ICD-10-CM | POA: Diagnosis not present

## 2017-11-30 DIAGNOSIS — R531 Weakness: Secondary | ICD-10-CM | POA: Diagnosis not present

## 2017-11-30 DIAGNOSIS — R351 Nocturia: Secondary | ICD-10-CM | POA: Insufficient documentation

## 2017-11-30 DIAGNOSIS — R5383 Other fatigue: Secondary | ICD-10-CM | POA: Insufficient documentation

## 2017-11-30 DIAGNOSIS — D751 Secondary polycythemia: Secondary | ICD-10-CM | POA: Diagnosis not present

## 2017-11-30 LAB — CBC
HEMATOCRIT: 50.4 % (ref 40.0–52.0)
HEMOGLOBIN: 17.3 g/dL (ref 13.0–18.0)
MCH: 32.9 pg (ref 26.0–34.0)
MCHC: 34.3 g/dL (ref 32.0–36.0)
MCV: 95.8 fL (ref 80.0–100.0)
Platelets: 225 10*3/uL (ref 150–440)
RBC: 5.26 MIL/uL (ref 4.40–5.90)
RDW: 12.8 % (ref 11.5–14.5)
WBC: 9.3 10*3/uL (ref 3.8–10.6)

## 2017-11-30 NOTE — Progress Notes (Signed)
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 Currently he is off testosterone for 1.5 months. He has been referred to us for elevated hemoglobin. Most recent labs from 04/13/2017 showed a mildly elevated total bilirubin of 1.7 with normal LFTs. BMPwere within normal limits. CBC showed white count of 6.5, H&H of 19.5/50.4 and a platelet count of 219. Iron studies showed B12 of 1068. Folate was greater than 20. Iron saturation was 47% and serum iron was normal at 153. Prior CBC from May 2016 showed H&H of 17.7/51.7 in December 2015 his H&H was 12.2/41.7 with an MCV of 72   The patient smoked for 30 years but quit smoking about 10 years ago. He has not been evaluated for obstructive sleep apnea. He does report a restful sleep but wakes up once or twice a night to go to the bathroom. Denies any loss of appetite or unintentional weight loss   Further blood work from 05/02/2017 was as follows: CBC showed white count of 8.2, H&H of 17.6/50.1 and a platelet count of 248. CMP was within normal limits. Jak 2 exon 12 mutation testing was negative. Urinalysis was negative for hematuria.  Jak 2 mutation testing was negative    Interval history-patient is currently on lower his dose of testosterone and says that he has been on 1 mL every 2 weeks as opposed to 2.5 mL in the past.  Has mild fatigue but denies other complaints of headaches, chest pain or dizziness  ECOG PS- 0 Pain scale- 0   Review of  systems- Review of Systems  Constitutional: Negative for chills, fever, malaise/fatigue and weight loss.  HENT: Negative for congestion, ear discharge and nosebleeds.   Eyes: Negative for blurred vision.  Respiratory: Negative for cough, hemoptysis, sputum production, shortness of breath and wheezing.   Cardiovascular: Negative for chest pain, palpitations, orthopnea and claudication.  Gastrointestinal: Negative for abdominal pain, blood in stool, constipation, diarrhea, heartburn, melena, nausea and vomiting.  Genitourinary: Negative for dysuria, flank pain, frequency, hematuria and urgency.  Musculoskeletal: Negative for back pain, joint pain and myalgias.  Skin: Negative for rash.  Neurological: Negative for dizziness, tingling, focal weakness, seizures, weakness and headaches.  Endo/Heme/Allergies: Does not bruise/bleed easily.  Psychiatric/Behavioral: Negative for depression and suicidal ideas. The patient does not have insomnia.       No Known Allergies   Past Medical History:  Diagnosis Date  . Anemia      No past surgical history on file.  Social History   Socioeconomic History  . Marital status: Married    Spouse name: Not on file  . Number of children: Not on file  . Years of education: Not on file  . Highest education level: Not on file  Social Needs  . Financial resource strain: Not on file  . Food insecurity - worry: Not on file  . Food insecurity - inability: Not on file  . Transportation needs - medical: Not on file  . Transportation needs - non-medical: Not on file  Occupational History  . Not on  file  Tobacco Use  . Smoking status: Former Smoker    Packs/day: 1.00    Last attempt to quit: 05/03/2007    Years since quitting: 10.5  . Smokeless tobacco: Never Used  Substance and Sexual Activity  . Alcohol use: Yes    Comment: occasional  . Drug use: No  . Sexual activity: Not on file  Other Topics Concern  . Not on file  Social History Narrative    . Not on file    Family History  Problem Relation Age of Onset  . Pulmonary fibrosis Maternal Uncle   . Pulmonary fibrosis Paternal Aunt   . Diabetes Paternal Grandmother   . Cancer Paternal Grandfather        lung     Current Outpatient Medications:  .  omeprazole (PRILOSEC) 40 MG capsule, Take by mouth., Disp: , Rfl:  .  omeprazole (PRILOSEC) 40 MG capsule, Take 40 mg by mouth daily., Disp: , Rfl:  .  QUEtiapine (SEROQUEL) 50 MG tablet, Take 50 mg by mouth at bedtime., Disp: , Rfl:   Physical exam:  Vitals:   11/30/17 1413  BP: (!) 141/95  Pulse: 81  Resp: 16  Temp: 97.7 F (36.5 C)  TempSrc: Tympanic  Weight: 208 lb (94.3 kg)   Physical Exam  Constitutional: He is oriented to person, place, and time and well-developed, well-nourished, and in no distress.  HENT:  Head: Normocephalic and atraumatic.  Eyes: EOM are normal. Pupils are equal, round, and reactive to light.  Neck: Normal range of motion.  Cardiovascular: Normal rate, regular rhythm and normal heart sounds.  Pulmonary/Chest: Effort normal and breath sounds normal.  Abdominal: Soft. Bowel sounds are normal.  Neurological: He is alert and oriented to person, place, and time.  Skin: Skin is warm and dry.     CMP Latest Ref Rng & Units 05/02/2017  Glucose 65 - 99 mg/dL 161(W)  BUN 6 - 20 mg/dL 19  Creatinine 9.60 - 4.54 mg/dL 0.98  Sodium 119 - 147 mmol/L 135  Potassium 3.5 - 5.1 mmol/L 4.2  Chloride 101 - 111 mmol/L 105  CO2 22 - 32 mmol/L 24  Calcium 8.9 - 10.3 mg/dL 9.5  Total Protein 6.5 - 8.1 g/dL 7.2  Total Bilirubin 0.3 - 1.2 mg/dL 1.2  Alkaline Phos 38 - 126 U/L 54  AST 15 - 41 U/L 23  ALT 17 - 63 U/L 29   CBC Latest Ref Rng & Units 11/30/2017  WBC 3.8 - 10.6 K/uL 9.3  Hemoglobin 13.0 - 18.0 g/dL 82.9  Hematocrit 56.2 - 52.0 % 50.4  Platelets 150 - 440 K/uL 225    Assessment and plan- Patient is a 51 y.o. male secondary polycythemia likely due to testosterone replacement  therapy  Recent testosterone levels checked on 11/02/2017 was 388.  Today's hematocrit is 50.4 and I will hold off on phlebotomy today and monitor his CBC closely.  Patient is currently back on testosterone at lowest possible doses.  I will repeat his CBC with differential monthly and if his hematocrit is any higher I will consider phlebotomy at that time.  I would also like him to get evaluated for obstructive sleep apnea to rule out any other cause of secondary polycythemia and I will touch base with Dr. Quillian Quince regarding this  rtc in 3 months with cbc with diff for possible phlebotomy   Visit Diagnosis 1. Polycythemia, secondary      Dr. Owens Shark, MD, MPH [facility] at [facility]  Pager- [phone] 11/30/2017 1:43 PM

## 2018-01-01 ENCOUNTER — Other Ambulatory Visit: Payer: Self-pay | Admitting: Oncology

## 2018-01-01 ENCOUNTER — Inpatient Hospital Stay: Payer: BLUE CROSS/BLUE SHIELD | Attending: Oncology

## 2018-01-01 ENCOUNTER — Inpatient Hospital Stay: Payer: BLUE CROSS/BLUE SHIELD

## 2018-01-01 DIAGNOSIS — D751 Secondary polycythemia: Secondary | ICD-10-CM

## 2018-01-01 LAB — CBC
HCT: 53.3 % — ABNORMAL HIGH (ref 40.0–52.0)
Hemoglobin: 18.3 g/dL — ABNORMAL HIGH (ref 13.0–18.0)
MCH: 32.4 pg (ref 26.0–34.0)
MCHC: 34.3 g/dL (ref 32.0–36.0)
MCV: 94.5 fL (ref 80.0–100.0)
PLATELETS: 245 10*3/uL (ref 150–440)
RBC: 5.64 MIL/uL (ref 4.40–5.90)
RDW: 12.5 % (ref 11.5–14.5)
WBC: 9 10*3/uL (ref 3.8–10.6)

## 2018-01-08 ENCOUNTER — Telehealth: Payer: Self-pay | Admitting: *Deleted

## 2018-01-08 NOTE — Telephone Encounter (Signed)
Called to tell pt that last week when he was here Sherri SearLelie had called Dr. Smith Robertao in [place] and it was suggested based o h/h that he needs to coe every 2 weeks and the next time would be 2/11 and he already has another scheduled for 2/28. He is acceptable for the one on 2/11. Lab 2 pm and poss phlebotomy 2:30/  I sent message to sch. To make appt.

## 2018-01-09 ENCOUNTER — Telehealth: Payer: Self-pay | Admitting: *Deleted

## 2018-01-09 NOTE — Telephone Encounter (Signed)
Called pt and left voicemail that the appt for 2/11 at 2 pm for labs and 2:30 for poss. Phlebotomy.  I had to change it to 2:30 for labs and 3 pm for poss. Phlebotomy.  There was no lab slots at 2 pm. I told him to call me if this does not work for him

## 2018-01-15 ENCOUNTER — Inpatient Hospital Stay: Payer: BLUE CROSS/BLUE SHIELD

## 2018-01-15 ENCOUNTER — Inpatient Hospital Stay: Payer: BLUE CROSS/BLUE SHIELD | Attending: Oncology

## 2018-01-15 DIAGNOSIS — D751 Secondary polycythemia: Secondary | ICD-10-CM | POA: Diagnosis present

## 2018-01-15 LAB — CBC
HCT: 49.2 % (ref 40.0–52.0)
HEMOGLOBIN: 16.9 g/dL (ref 13.0–18.0)
MCH: 32.5 pg (ref 26.0–34.0)
MCHC: 34.4 g/dL (ref 32.0–36.0)
MCV: 94.5 fL (ref 80.0–100.0)
Platelets: 236 10*3/uL (ref 150–440)
RBC: 5.2 MIL/uL (ref 4.40–5.90)
RDW: 12.5 % (ref 11.5–14.5)
WBC: 8.3 10*3/uL (ref 3.8–10.6)

## 2018-01-15 NOTE — Progress Notes (Signed)
Per MD order hold phlebotomy if hematocrit less than 50. Today's Hematocrit 49.2. No phlebotomy today. Pt aware and denies any concerns at this time. Pt stable at discharge.

## 2018-01-24 ENCOUNTER — Inpatient Hospital Stay: Payer: BLUE CROSS/BLUE SHIELD

## 2018-01-24 DIAGNOSIS — D751 Secondary polycythemia: Secondary | ICD-10-CM

## 2018-01-24 LAB — CBC
HEMATOCRIT: 50.9 % (ref 40.0–52.0)
HEMOGLOBIN: 17.4 g/dL (ref 13.0–18.0)
MCH: 32.3 pg (ref 26.0–34.0)
MCHC: 34.1 g/dL (ref 32.0–36.0)
MCV: 94.6 fL (ref 80.0–100.0)
Platelets: 246 10*3/uL (ref 150–440)
RBC: 5.38 MIL/uL (ref 4.40–5.90)
RDW: 12.3 % (ref 11.5–14.5)
WBC: 8.4 10*3/uL (ref 3.8–10.6)

## 2018-02-01 ENCOUNTER — Other Ambulatory Visit: Payer: Self-pay

## 2018-02-27 ENCOUNTER — Other Ambulatory Visit: Payer: Self-pay

## 2018-02-27 DIAGNOSIS — D751 Secondary polycythemia: Secondary | ICD-10-CM

## 2018-03-01 ENCOUNTER — Inpatient Hospital Stay: Payer: BLUE CROSS/BLUE SHIELD

## 2018-03-01 ENCOUNTER — Inpatient Hospital Stay: Payer: BLUE CROSS/BLUE SHIELD | Attending: Oncology | Admitting: Oncology

## 2018-03-01 VITALS — BP 134/84 | HR 73 | Temp 98.4°F | Ht 68.0 in | Wt 198.5 lb

## 2018-03-01 DIAGNOSIS — Z79890 Hormone replacement therapy (postmenopausal): Secondary | ICD-10-CM

## 2018-03-01 DIAGNOSIS — Z87891 Personal history of nicotine dependence: Secondary | ICD-10-CM | POA: Diagnosis not present

## 2018-03-01 DIAGNOSIS — D751 Secondary polycythemia: Secondary | ICD-10-CM

## 2018-03-01 LAB — CBC WITH DIFFERENTIAL/PLATELET
BASOS ABS: 0.1 10*3/uL (ref 0–0.1)
BASOS PCT: 1 %
EOS ABS: 0.2 10*3/uL (ref 0–0.7)
EOS PCT: 2 %
HCT: 46.2 % (ref 40.0–52.0)
Hemoglobin: 16.1 g/dL (ref 13.0–18.0)
LYMPHS PCT: 25 %
Lymphs Abs: 1.8 10*3/uL (ref 1.0–3.6)
MCH: 32.5 pg (ref 26.0–34.0)
MCHC: 34.8 g/dL (ref 32.0–36.0)
MCV: 93.3 fL (ref 80.0–100.0)
MONO ABS: 0.5 10*3/uL (ref 0.2–1.0)
Monocytes Relative: 7 %
Neutro Abs: 4.8 10*3/uL (ref 1.4–6.5)
Neutrophils Relative %: 65 %
PLATELETS: 255 10*3/uL (ref 150–440)
RBC: 4.95 MIL/uL (ref 4.40–5.90)
RDW: 12.3 % (ref 11.5–14.5)
WBC: 7.3 10*3/uL (ref 3.8–10.6)

## 2018-03-01 NOTE — Addendum Note (Signed)
Addended by: Corene CorneaVENABLE, Daelyn Mozer Y on: 03/01/2018 02:13 PM   Modules accepted: Orders

## 2018-03-01 NOTE — Progress Notes (Signed)
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 Currently he is off testosterone for 1.5 months. He has been referred to [place] for elevated hemoglobin. Most recent labs from 04/13/2017 showed a mildly elevated total bilirubin of 1.7 with normal LFTs. BMPwere within normal limits. CBC showed white count of 6.5, H&H of 19.5/50.4 and a platelet count of 219. Iron studies showed B12 of 1068. Folate was greater than 20. Iron saturation was 47% and serum iron was normal at 153. Prior CBC from May 2016 showed H&H of 17.7/51.7 in December 2015 his H&H was 12.2/41.7 with an MCV of 72   The patient smoked for 30 years but quit smoking about 10 years ago. He has not been evaluated for obstructive sleep apnea. He does report a restful sleep but wakes up once or twice a night to go to the bathroom. Denies any loss of appetite or unintentional weight loss   Further blood work from 05/02/2017 was as follows: CBC showed white count of 8.2, H&H of 17.6/50.1 and a platelet count of 248. CMP was within normal limits. Jak 2 exon 12 mutation testing was negative. Urinalysis was negative for hematuria.  Jak 2 mutation testing was negative  Testosterone replacement low dose restarted sometime in October 2018  Interval history- remains on low dose testosterone. Denies any fatigue or other side efefcts today  ECOG PS- 0 Pain scale- 0   Review of systems- Review of Systems  Constitutional: Negative for chills, fever,  malaise/fatigue and weight loss.  HENT: Negative for congestion, ear discharge and nosebleeds.   Eyes: Negative for blurred vision.  Respiratory: Negative for cough, hemoptysis, sputum production, shortness of breath and wheezing.   Cardiovascular: Negative for chest pain, palpitations, orthopnea and claudication.  Gastrointestinal: Negative for abdominal pain, blood in stool, constipation, diarrhea, heartburn, melena, nausea and vomiting.  Genitourinary: Negative for dysuria, flank pain, frequency, hematuria and urgency.  Musculoskeletal: Negative for back pain, joint pain and myalgias.  Skin: Negative for rash.  Neurological: Negative for dizziness, tingling, focal weakness, seizures, weakness and headaches.  Endo/Heme/Allergies: Does not bruise/bleed easily.  Psychiatric/Behavioral: Negative for depression and suicidal ideas. The patient does not have insomnia.      No Known Allergies   Past Medical History:  Diagnosis Date  . Anemia      No past surgical history on file.  Social History   Socioeconomic History  . Marital status: Married    Spouse name: Not on file  . Number of children: Not on file  . Years of education: Not on file  . Highest education level: Not on file  Occupational History  . Not on file  Social Needs  . Financial resource strain: Not on file  . Food insecurity:    Worry: Not on file    Inability: Not on file  . Transportation needs:    Medical: Not on file    Non-medical: Not on file  Tobacco Use  . Smoking status: Former Smoker    Packs/day: 1.00    Last attempt to quit: 05/03/2007  Years since quitting: 10.8  . Smokeless tobacco: Never Used  Substance and Sexual Activity  . Alcohol use: Yes    Comment: occasional  . Drug use: No  . Sexual activity: Yes  Lifestyle  . Physical activity:    Days per week: Not on file    Minutes per session: Not on file  . Stress: Not on file  Relationships  . Social connections:    Talks on phone:  Not on file    Gets together: Not on file    Attends religious service: Not on file    Active member of club or organization: Not on file    Attends meetings of clubs or organizations: Not on file    Relationship status: Not on file  . Intimate partner violence:    Fear of current or ex partner: Not on file    Emotionally abused: Not on file    Physically abused: Not on file    Forced sexual activity: Not on file  Other Topics Concern  . Not on file  Social History Narrative  . Not on file    Family History  Problem Relation Age of Onset  . Pulmonary fibrosis Maternal Uncle   . Pulmonary fibrosis Paternal Aunt   . Diabetes Paternal Grandmother   . Cancer Paternal Grandfather        lung     Current Outpatient Medications:  .  omeprazole (PRILOSEC) 40 MG capsule, Take by mouth., Disp: , Rfl:  .  testosterone cypionate (DEPOTESTOTERONE CYPIONATE) 100 MG/ML injection, Inject 100 mg into the muscle every 14 (fourteen) days. For IM use only, Disp: , Rfl:   Physical exam:  Vitals:   03/01/18 1356  BP: 134/84  Pulse: 73  Temp: 98.4 F (36.9 C)  TempSrc: Tympanic  SpO2: 98%  Weight: 198 lb 8 oz (90 kg)  Height: 5\' 8"  (1.727 m)   Physical Exam  Constitutional: He is oriented to person, place, and time and well-developed, well-nourished, and in no distress.  HENT:  Head: Normocephalic and atraumatic.  Eyes: Pupils are equal, round, and reactive to light. EOM are normal.  Neck: Normal range of motion.  Cardiovascular: Normal rate, regular rhythm and normal heart sounds.  Pulmonary/Chest: Effort normal and breath sounds normal.  Abdominal: Soft. Bowel sounds are normal.  Neurological: He is alert and oriented to person, place, and time.  Skin: Skin is warm and dry.     CMP Latest Ref Rng & Units 05/02/2017  Glucose 65 - 99 mg/dL 161(W100(H)  BUN 6 - 20 mg/dL 19  Creatinine 9.600.61 - 4.541.24 mg/dL 0.980.99  Sodium 119135 - 147145 mmol/L 135  Potassium 3.5 - 5.1 mmol/L 4.2  Chloride 101 -  111 mmol/L 105  CO2 22 - 32 mmol/L 24  Calcium 8.9 - 10.3 mg/dL 9.5  Total Protein 6.5 - 8.1 g/dL 7.2  Total Bilirubin 0.3 - 1.2 mg/dL 1.2  Alkaline Phos 38 - 126 U/L 54  AST 15 - 41 U/L 23  ALT 17 - 63 U/L 29   CBC Latest Ref Rng & Units 03/01/2018  WBC 3.8 - 10.6 K/uL 7.3  Hemoglobin 13.0 - 18.0 g/dL 82.916.1  Hematocrit 56.240.0 - 52.0 % 46.2  Platelets 150 - 440 K/uL 255    Assessment and plan- Patient is a 52 y.o. 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 Hct remains stable <50. No indication for phlebotomy today. Repeat cbc in 3 and 6  months and I will see him in 6 months for possible phlebotomy. Will consider phlebotomy if hct >50   Visit Diagnosis 1. Polycythemia, secondary      Dr. Owens Shark, MD, MPH [facility] at [facility] Pager- [phone] 03/01/2018 2:11 PM

## 2018-03-01 NOTE — Progress Notes (Signed)
No new changes noted today 

## 2018-06-01 ENCOUNTER — Inpatient Hospital Stay: Payer: BLUE CROSS/BLUE SHIELD | Attending: Oncology

## 2018-06-01 DIAGNOSIS — D751 Secondary polycythemia: Secondary | ICD-10-CM | POA: Diagnosis not present

## 2018-06-01 LAB — CBC
HCT: 44 % (ref 40.0–52.0)
Hemoglobin: 15.2 g/dL (ref 13.0–18.0)
MCH: 32 pg (ref 26.0–34.0)
MCHC: 34.6 g/dL (ref 32.0–36.0)
MCV: 92.6 fL (ref 80.0–100.0)
Platelets: 204 10*3/uL (ref 150–440)
RBC: 4.75 MIL/uL (ref 4.40–5.90)
RDW: 13.1 % (ref 11.5–14.5)
WBC: 6.9 10*3/uL (ref 3.8–10.6)

## 2018-08-31 ENCOUNTER — Inpatient Hospital Stay: Payer: BLUE CROSS/BLUE SHIELD | Admitting: Oncology

## 2018-08-31 ENCOUNTER — Inpatient Hospital Stay: Payer: BLUE CROSS/BLUE SHIELD | Attending: Oncology

## 2018-08-31 ENCOUNTER — Inpatient Hospital Stay: Payer: BLUE CROSS/BLUE SHIELD

## 2019-09-12 ENCOUNTER — Ambulatory Visit: Payer: Self-pay

## 2019-09-12 ENCOUNTER — Other Ambulatory Visit: Payer: Self-pay

## 2019-09-12 VITALS — BP 142/82 | HR 76 | Resp 16 | Ht 68.0 in | Wt 193.0 lb

## 2019-09-12 DIAGNOSIS — Z008 Encounter for other general examination: Secondary | ICD-10-CM

## 2019-09-12 LAB — POCT LIPID PANEL
HDL: 43
LDL: 130
Non-HDL: 152
POC Glucose: 108 mg/dl — AB (ref 70–99)
TC/HDL: 4.6
TC: 195
TRG: 113

## 2019-09-12 NOTE — Progress Notes (Signed)
     Patient ID: Mark Riddle, male    DOB: 04-26-1966, 53 y.o.   MRN: [id number]    Thank you!!  [place] Nurse Specialist [place]: [phone]  Cell:  [phone] Website: [email or web address]

## 2019-10-11 ENCOUNTER — Other Ambulatory Visit: Payer: Self-pay

## 2019-10-11 DIAGNOSIS — Z20822 Contact with and (suspected) exposure to covid-19: Secondary | ICD-10-CM

## 2019-10-12 LAB — NOVEL CORONAVIRUS, NAA: SARS-CoV-2, NAA: NOT DETECTED

## 2019-10-15 ENCOUNTER — Other Ambulatory Visit: Payer: Self-pay

## 2019-10-15 DIAGNOSIS — Z20822 Contact with and (suspected) exposure to covid-19: Secondary | ICD-10-CM

## 2019-10-17 LAB — NOVEL CORONAVIRUS, NAA: SARS-CoV-2, NAA: NOT DETECTED

## 2020-09-17 ENCOUNTER — Telehealth: Payer: Self-pay

## 2020-09-17 DIAGNOSIS — Z87891 Personal history of nicotine dependence: Secondary | ICD-10-CM

## 2020-09-17 DIAGNOSIS — Z122 Encounter for screening for malignant neoplasm of respiratory organs: Secondary | ICD-10-CM

## 2020-09-17 NOTE — Telephone Encounter (Signed)
Contacted patient today for lung CT screening clinic.  Patient was referred in 2018, but requirements for the program at that time began at age 54.  I have discussed CT screening program with patient and he is agreeable and CT scheduled for Tues Nov 23 at 2:00pm.  He has asked [place] to text him the details.  Patient is a former smoker who stopped smoking at age 82.  He started smoking at age 65 and smoked 2 packs at day.  He confirmed his current insurance is [employer]

## 2020-09-18 NOTE — Telephone Encounter (Signed)
Former smoker, quit 2008, 60 pack year

## 2020-09-18 NOTE — Addendum Note (Signed)
Addended by: Jonne Ply on: 09/18/2020 12:41 PM   Modules accepted: Orders

## 2020-10-26 ENCOUNTER — Inpatient Hospital Stay: Payer: BC Managed Care – PPO | Attending: Oncology | Admitting: Oncology

## 2020-10-26 DIAGNOSIS — Z87891 Personal history of nicotine dependence: Secondary | ICD-10-CM

## 2020-10-26 NOTE — Progress Notes (Signed)
Virtual Visit via Video Note  I connected with Mark Riddle on 10/26/20 at  2:00 PM EST by a video enabled telemedicine application and verified that I am speaking with the correct person using two identifiers.  Location: Patient: Home Provider: Clinic    I discussed the limitations of evaluation and management by telemedicine and the availability of in person appointments. The patient expressed understanding and agreed to proceed.  I discussed the assessment and treatment plan with the patient. The patient was provided an opportunity to ask questions and all were answered. The patient agreed with the plan and demonstrated an understanding of the instructions.   The patient was advised to call back or seek an in-person evaluation if the symptoms worsen or if the condition fails to improve as anticipated.   In accordance with CMS guidelines, patient has met eligibility criteria including age, absence of signs or symptoms of lung cancer.  Social History   Tobacco Use  . Smoking status: Former Smoker    Packs/day: 1.00    Quit date: 05/03/2007    Years since quitting: 13.4  . Smokeless tobacco: Never Used  Vaping Use  . Vaping Use: Never used  Substance Use Topics  . Alcohol use: Yes    Comment: occasional  . Drug use: No      A shared decision-making session was conducted prior to the performance of CT scan. This includes one or more decision aids, includes benefits and harms of screening, follow-up diagnostic testing, over-diagnosis, false positive rate, and total radiation exposure.   Counseling on the importance of adherence to annual lung cancer LDCT screening, impact of co-morbidities, and ability or willingness to undergo diagnosis and treatment is imperative for compliance of the program.   Counseling on the importance of continued smoking cessation for former smokers; the importance of smoking cessation for current smokers, and information about tobacco cessation interventions  have been given to patient including [place] and [place] quit  programs.   Written order for lung cancer screening with LDCT has been given to the patient and any and all questions have been answered to the best of my abilities.    Yearly follow up will be coordinated by Burgess Estelle, Thoracic Navigator.  I provided 15 minutes of face-to-face video visit time during this encounter, and > 50% was spent counseling as documented under my assessment & plan.   [name], NP

## 2020-10-27 ENCOUNTER — Other Ambulatory Visit: Payer: Self-pay

## 2020-10-27 ENCOUNTER — Inpatient Hospital Stay (HOSPITAL_BASED_OUTPATIENT_CLINIC_OR_DEPARTMENT_OTHER): Payer: BC Managed Care – PPO | Admitting: Nurse Practitioner

## 2020-10-27 ENCOUNTER — Ambulatory Visit
Admission: RE | Admit: 2020-10-27 | Discharge: 2020-10-27 | Disposition: A | Discharge status: Home / Self Care | Payer: BC Managed Care – PPO | Source: Ambulatory Visit | Attending: Oncology | Admitting: Oncology

## 2020-10-27 ENCOUNTER — Encounter: Payer: Self-pay | Admitting: Nurse Practitioner

## 2020-10-27 DIAGNOSIS — Z87891 Personal history of nicotine dependence: Secondary | ICD-10-CM | POA: Insufficient documentation

## 2020-10-27 DIAGNOSIS — Z122 Encounter for screening for malignant neoplasm of respiratory organs: Secondary | ICD-10-CM | POA: Diagnosis present

## 2020-10-27 NOTE — Progress Notes (Signed)
Virtual Visit via Video Enabled Telemedicine Note   I connected with Harrel Carina on 10/27/20 at 2:00 PM EST by video enabled telemedicine visit and verified that I am speaking with the correct person using two identifiers.   I discussed the limitations, risks, security and privacy concerns of performing an evaluation and management service by telemedicine and the availability of in-person appointments. I also discussed with the patient that there may be a patient responsible charge related to this service. The patient expressed understanding and agreed to proceed.   Other persons participating in the visit and their role in the encounter: Burgess Estelle, RN- checking in patient & navigation  Patient's location: [address]  Provider's location: Clinic  Chief Complaint: Low Dose CT Screening  Patient agreed to evaluation by telemedicine to discuss shared decision making for consideration of low dose CT lung cancer screening.    In accordance with CMS guidelines, patient has met eligibility criteria including age, absence of signs or symptoms of lung cancer.  Social History   Tobacco Use  . Smoking status: Former Smoker    Packs/day: 1.50    Years: 40.00    Pack years: 60.00    Quit date: 05/03/2007    Years since quitting: 13.4  . Smokeless tobacco: Never Used  Substance Use Topics  . Alcohol use: Yes    Comment: occasional     A shared decision-making session was conducted prior to the performance of CT scan. This includes one or more decision aids, includes benefits and harms of screening, follow-up diagnostic testing, over-diagnosis, false positive rate, and total radiation exposure.   Counseling on the importance of adherence to annual lung cancer LDCT screening, impact of co-morbidities, and ability or willingness to undergo diagnosis and treatment is imperative for compliance of the program.   Counseling on the importance of continued smoking cessation for former  smokers; the importance of smoking cessation for current smokers, and information about tobacco cessation interventions have been given to patient including [place] and [employer] [place] programs.   Written order for lung cancer screening with LDCT has been given to the patient and any and all questions have been answered to the best of my abilities.    Yearly follow up will be coordinated by Burgess Estelle, Thoracic Navigator.  I discussed the assessment and treatment plan with the patient. The patient was provided an opportunity to ask questions and all were answered. The patient agreed with the plan and demonstrated an understanding of the instructions.   The patient was advised to call back or seek an in-person evaluation if the symptoms worsen or if the condition fails to improve as anticipated.   I provided 15 minutes of face-to-face video visit time during this encounter, and > 50% was spent counseling as documented under my assessment & plan.   Beckey Rutter, DNP, AGNP-C [place] at [facility] [phone] (clinic)

## 2020-11-04 ENCOUNTER — Encounter: Payer: Self-pay | Admitting: *Deleted

## 2021-07-02 ENCOUNTER — Other Ambulatory Visit: Payer: Self-pay

## 2021-07-02 ENCOUNTER — Encounter: Payer: Self-pay | Admitting: Family Medicine

## 2021-07-02 MED ORDER — PEG 3350-KCL-NA BICARB-NACL 420 G PO SOLR: ORAL | 0 refills | Status: DC

## 2021-07-06 ENCOUNTER — Emergency Department
Admission: EM | Admit: 2021-07-06 | Discharge: 2021-07-06 | Disposition: A | Discharge status: Home / Self Care | Payer: BC Managed Care – PPO | Attending: Emergency Medicine | Admitting: Emergency Medicine

## 2021-07-06 ENCOUNTER — Other Ambulatory Visit: Payer: Self-pay

## 2021-07-06 DIAGNOSIS — R197 Diarrhea, unspecified: Secondary | ICD-10-CM | POA: Diagnosis present

## 2021-07-06 DIAGNOSIS — R509 Fever, unspecified: Secondary | ICD-10-CM | POA: Diagnosis not present

## 2021-07-06 DIAGNOSIS — K648 Other hemorrhoids: Secondary | ICD-10-CM | POA: Diagnosis not present

## 2021-07-06 DIAGNOSIS — R1031 Right lower quadrant pain: Secondary | ICD-10-CM | POA: Insufficient documentation

## 2021-07-06 DIAGNOSIS — K644 Residual hemorrhoidal skin tags: Secondary | ICD-10-CM | POA: Diagnosis not present

## 2021-07-06 DIAGNOSIS — R1032 Left lower quadrant pain: Secondary | ICD-10-CM | POA: Diagnosis not present

## 2021-07-06 DIAGNOSIS — Z87891 Personal history of nicotine dependence: Secondary | ICD-10-CM | POA: Insufficient documentation

## 2021-07-06 LAB — CBC
HCT: 49.8 % (ref 39.0–52.0)
Hemoglobin: 16.7 g/dL (ref 13.0–17.0)
MCH: 29.9 pg (ref 26.0–34.0)
MCHC: 33.5 g/dL (ref 30.0–36.0)
MCV: 89.2 fL (ref 80.0–100.0)
Platelets: 191 10*3/uL (ref 150–400)
RBC: 5.58 MIL/uL (ref 4.22–5.81)
RDW: 15.9 % — ABNORMAL HIGH (ref 11.5–15.5)
WBC: 7.2 10*3/uL (ref 4.0–10.5)
nRBC: 0 % (ref 0.0–0.2)

## 2021-07-06 LAB — COMPREHENSIVE METABOLIC PANEL
ALT: 25 U/L (ref 0–44)
AST: 23 U/L (ref 15–41)
Albumin: 3.7 g/dL (ref 3.5–5.0)
Alkaline Phosphatase: 52 U/L (ref 38–126)
Anion gap: 6 (ref 5–15)
BUN: 10 mg/dL (ref 6–20)
CO2: 24 mmol/L (ref 22–32)
Calcium: 8.3 mg/dL — ABNORMAL LOW (ref 8.9–10.3)
Chloride: 104 mmol/L (ref 98–111)
Creatinine, Ser: 1.13 mg/dL (ref 0.61–1.24)
GFR, Estimated: >60 mL/min (ref >60)
Glucose, Bld: 104 mg/dL — ABNORMAL HIGH (ref 70–99)
Potassium: 4 mmol/L (ref 3.5–5.1)
Sodium: 134 mmol/L — ABNORMAL LOW (ref 135–145)
Total Bilirubin: 1.4 mg/dL — ABNORMAL HIGH (ref 0.3–1.2)
Total Protein: 6.9 g/dL (ref 6.5–8.1)

## 2021-07-06 LAB — LIPASE, BLOOD: Lipase: 31 U/L (ref 11–51)

## 2021-07-06 MED ORDER — KETOROLAC TROMETHAMINE 30 MG/ML IJ SOLN
15.0000 mg | Freq: Once | INTRAMUSCULAR | Status: AC
  Administered 2021-07-06: 15 mg via INTRAVENOUS
  Filled 2021-07-06: qty 1

## 2021-07-06 MED ORDER — LACTATED RINGERS IV BOLUS
1000.0000 mL | Freq: Once | INTRAVENOUS | Status: AC
  Administered 2021-07-06: 1000 mL via INTRAVENOUS

## 2021-07-06 NOTE — ED Provider Notes (Addendum)
[facility]  ____________________________________________   Event Date/Time   First MD Initiated Contact with Patient 07/06/21 0940     (approximate)  I have reviewed the triage vital signs and the nursing notes.   HISTORY  Chief Complaint No chief complaint on file.    HPI Mark Riddle is a 55 y.o. male with no past medical history who presents with abdominal pain diarrhea and fever.  Symptoms started about 3 days ago.  He has had multiple episodes of loose stool with occasional blood with wiping and in the toilet.  Has had some bilateral lower quadrant pain that is intermittent.  Also had a fever of 102 2 days ago.  Has been tolerating p.o. no nausea or vomiting.  Denies urinary symptoms.  No recent antibiotic use or travel.  Patient has a history of hemorrhoids with hemorrhoidal bleeding.         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 No pertinent surgical history.  Prior to Admission medications   Medication Sig Start Date End Date Taking? Authorizing Provider  omeprazole (PRILOSEC) 40 MG capsule Take by mouth.    [provider]  polyethylene glycol-electrolytes (NULYTELY) 420 g solution Drink one 8 oz glass every 20 mins until entire container is finished staring at 5:00pm on 07/20/21 07/02/21   Pasty Spillers, MD  testosterone cypionate (DEPOTESTOTERONE CYPIONATE) 100 MG/ML injection Inject 100 mg into the muscle every 14 (fourteen) days. For IM use only    [provider]    Allergies Patient has no known allergies.  Family History  Problem Relation Age of Onset   Pulmonary fibrosis Maternal Uncle    Pulmonary fibrosis Paternal Aunt    Diabetes Paternal Grandmother    Cancer Paternal Grandfather        lung    Social History Social History   Tobacco Use   Smoking status: Former     Packs/day: 1.50    Years: 40.00    Pack years: 60.00    Types: Cigarettes    Quit date: 05/03/2007    Years since quitting: 14.1   Smokeless tobacco: Never  Vaping Use   Vaping Use: Never used  Substance Use Topics   Alcohol use: Yes    Comment: occasional   Drug use: No    Review of Systems   Review of Systems  Constitutional:  Positive for fever. Negative for appetite change.  HENT:  Negative for congestion.   Respiratory:  Negative for chest tightness and shortness of breath.   Cardiovascular:  Negative for chest pain.  Gastrointestinal:  Positive for abdominal pain, anal bleeding, blood in stool and diarrhea. Negative for nausea and vomiting.  Genitourinary:  Negative for dysuria.  All other systems reviewed and are negative.  Physical Exam Updated Vital Signs BP 132/89   Pulse 73   Temp 97.7 F (36.5 C) (Oral)   Resp 16   Ht 5\' 8"  (1.727 m)   Wt 93 kg   SpO2 98%   BMI 31.17 kg/m   Physical Exam Vitals and nursing note reviewed.  Constitutional:      General: He is not in acute distress.    Appearance: Normal appearance.  HENT:     Head: Normocephalic and atraumatic.  Eyes:     General: No scleral icterus.    Conjunctiva/sclera: Conjunctivae normal.  Pulmonary:     Effort: Pulmonary effort is normal. No respiratory distress.     Breath sounds: Normal breath sounds. No wheezing.  Abdominal:     General: Abdomen is flat. There is no distension.     Tenderness: There is no abdominal tenderness. There is no guarding.  Genitourinary:    Comments: Protruding internal hemorrhoid and external hemorrhoids, no blood in the stool Musculoskeletal:        General: No deformity or signs of injury.     Cervical back: Normal range of motion.  Skin:    Coloration: Skin is not jaundiced or pale.  Neurological:     General: No focal deficit present.     Mental Status: He is alert and oriented to person, place, and time. Mental status is at baseline.  Psychiatric:         Mood and Affect: Mood normal.        Behavior: Behavior normal.     LABS (all labs ordered are listed, but only abnormal results are displayed)  Labs Reviewed  COMPREHENSIVE METABOLIC PANEL - Abnormal; Notable for the following components:      Result Value   Sodium 134 (*)    Glucose, Bld 104 (*)    Calcium 8.3 (*)    Total Bilirubin 1.4 (*)    All other components within normal limits  CBC - Abnormal; Notable for the following components:   RDW 15.9 (*)    All other components within normal limits  LIPASE, BLOOD  URINALYSIS, COMPLETE (UACMP) WITH MICROSCOPIC   ____________________________________________  EKG  N/A RADIOLOGY I, Randol Kern, personally viewed and evaluated these images (plain radiographs) as part of my medical decision making, as well as reviewing the written report by the radiologist.  ED MD interpretation:  n/a    ____________________________________________   PROCEDURES  Procedure(s) performed (including Critical Care):  Procedures   ____________________________________________   INITIAL IMPRESSION / ASSESSMENT AND PLAN / ED COURSE  55 year old male who presents with diarrhea abdominal pain and fever.  Vital signs within normal limits.  On exam he is well-appearing abdomen is benign nontender.  Labs also reassuring.  He does note some blood in the stool but on exam he has an protruding internal hemorrhoid and brown stool on rectal exam so I have low suspicion for GI bleed.  No indication for CT imaging based on his exam.  I suspect Colitis.  No recent antibiotic use or travel.  Will discharge with return precautions.      ____________________________________________   FINAL CLINICAL IMPRESSION(S) / ED DIAGNOSES  Final diagnoses:  Diarrhea, unspecified type     ED Discharge Orders     None        Note:  This document was prepared using Dragon voice recognition software and may include unintentional dictation  errors.    Georga Hacking, MD 07/06/21 1155    Georga Hacking, MD 07/06/21 1155

## 2021-07-06 NOTE — ED Triage Notes (Signed)
Presents  lower abd pain with diarrhea   sx's started on Sunday  febrile at home

## 2021-07-06 NOTE — Discharge Instructions (Addendum)
You likely have a viral infection that is causing your diarrhea.  Your blood work was reassuring today.  If you develop fevers or your abdominal pain is worsening, please return to the emergency department.

## 2021-07-21 ENCOUNTER — Encounter
Admission: RE | Disposition: A | Discharge status: Home / Self Care | Payer: Self-pay | Source: Home / Self Care | Attending: Gastroenterology

## 2021-07-21 ENCOUNTER — Encounter: Payer: Self-pay | Admitting: Gastroenterology

## 2021-07-21 ENCOUNTER — Ambulatory Visit: Payer: BC Managed Care – PPO | Admitting: Anesthesiology

## 2021-07-21 ENCOUNTER — Ambulatory Visit
Admission: RE | Admit: 2021-07-21 | Discharge: 2021-07-21 | Disposition: A | Discharge status: Home / Self Care | Payer: BC Managed Care – PPO | Attending: Gastroenterology | Admitting: Gastroenterology

## 2021-07-21 ENCOUNTER — Other Ambulatory Visit: Payer: Self-pay

## 2021-07-21 DIAGNOSIS — Z1211 Encounter for screening for malignant neoplasm of colon: Secondary | ICD-10-CM | POA: Insufficient documentation

## 2021-07-21 DIAGNOSIS — K635 Polyp of colon: Secondary | ICD-10-CM | POA: Diagnosis not present

## 2021-07-21 DIAGNOSIS — Z79899 Other long term (current) drug therapy: Secondary | ICD-10-CM | POA: Diagnosis not present

## 2021-07-21 DIAGNOSIS — Z79890 Hormone replacement therapy (postmenopausal): Secondary | ICD-10-CM | POA: Insufficient documentation

## 2021-07-21 DIAGNOSIS — Z87891 Personal history of nicotine dependence: Secondary | ICD-10-CM | POA: Diagnosis not present

## 2021-07-21 HISTORY — PX: COLONOSCOPY: SHX5424

## 2021-07-21 SURGERY — COLONOSCOPY
Anesthesia: General

## 2021-07-21 MED ORDER — GLYCOPYRROLATE 0.2 MG/ML IJ SOLN
INTRAMUSCULAR | Status: AC
  Filled 2021-07-21: qty 1

## 2021-07-21 MED ORDER — LIDOCAINE HCL (CARDIAC) PF 100 MG/5ML IV SOSY
PREFILLED_SYRINGE | INTRAVENOUS | Status: DC | PRN
  Administered 2021-07-21: 50 mg via INTRAVENOUS

## 2021-07-21 MED ORDER — PROPOFOL 500 MG/50ML IV EMUL
INTRAVENOUS | Status: DC | PRN
  Administered 2021-07-21: 150 ug/kg/min via INTRAVENOUS

## 2021-07-21 MED ORDER — SODIUM CHLORIDE 0.9 % IV SOLN: INTRAVENOUS | Status: DC

## 2021-07-21 MED ORDER — PROPOFOL 10 MG/ML IV BOLUS
INTRAVENOUS | Status: DC | PRN
  Administered 2021-07-21: 100 mg via INTRAVENOUS

## 2021-07-21 MED ORDER — PROPOFOL 500 MG/50ML IV EMUL
INTRAVENOUS | Status: AC
  Filled 2021-07-21: qty 50

## 2021-07-21 MED ORDER — GLYCOPYRROLATE 0.2 MG/ML IJ SOLN
INTRAMUSCULAR | Status: DC | PRN
  Administered 2021-07-21: .2 mg via INTRAVENOUS

## 2021-07-21 NOTE — Anesthesia Preprocedure Evaluation (Signed)
Anesthesia Evaluation  Patient identified by MRN, date of birth, ID band Patient awake    Reviewed: Allergy & Precautions, NPO status , Patient's Chart, lab work & pertinent test results  History of Anesthesia Complications Negative for: history of anesthetic complications  Airway Mallampati: II  TM Distance: >3 FB Neck ROM: Full    Dental no notable dental hx. (+) Teeth Intact   Pulmonary neg pulmonary ROS, neg sleep apnea, neg COPD, Patient abstained from smoking.Not current smoker, former smoker,    Pulmonary exam normal breath sounds clear to auscultation       Cardiovascular Exercise Tolerance: Good METS(-) hypertension(-) CAD and (-) Past MI negative cardio ROS  (-) dysrhythmias  Rhythm:Regular Rate:Normal - Systolic murmurs    Neuro/Psych negative neurological ROS  negative psych ROS   GI/Hepatic GERD  ,(+)     (-) substance abuse  ,   Endo/Other  neg diabetes  Renal/GU negative Renal ROS     Musculoskeletal   Abdominal   Peds  Hematology   Anesthesia Other Findings Past Medical History: No date: Anemia  Reproductive/Obstetrics                             Anesthesia Physical Anesthesia Plan  ASA: 2  Anesthesia Plan: General   Post-op Pain Management:    Induction: Intravenous  PONV Risk Score and Plan: 2 and Ondansetron, Propofol infusion and TIVA  Airway Management Planned: Nasal Cannula  Additional Equipment: None  Intra-op Plan:   Post-operative Plan:   Informed Consent: I have reviewed the patients History and Physical, chart, labs and discussed the procedure including the risks, benefits and alternatives for the proposed anesthesia with the patient or authorized representative who has indicated his/her understanding and acceptance.     Dental advisory given  Plan Discussed with: CRNA and Surgeon  Anesthesia Plan Comments: (Discussed risks of anesthesia  with patient, including possibility of difficulty with spontaneous ventilation under anesthesia necessitating airway intervention, PONV, and rare risks such as cardiac or respiratory or neurological events, and allergic reactions. Patient understands.)        Anesthesia Quick Evaluation

## 2021-07-21 NOTE — Transfer of Care (Signed)
Immediate Anesthesia Transfer of Care Note  Patient: Mark Riddle  Procedure(s) Performed: COLONOSCOPY  Patient Location: PACU and Endoscopy Unit  Anesthesia Type:General  Level of Consciousness: drowsy and patient cooperative  Airway & Oxygen Therapy: Patient Spontanous Breathing  Post-op Assessment: Report given to RN and Post -op Vital signs reviewed and stable  Post vital signs: Reviewed and stable  Last Vitals:  Vitals Value Taken Time  BP    Temp    Pulse    Resp    SpO2      Last Pain:  Vitals:   07/21/21 0927  TempSrc:   PainSc: 0-No pain         Complications: No notable events documented.

## 2021-07-21 NOTE — Anesthesia Postprocedure Evaluation (Signed)
Anesthesia Post Note  Patient: Mark Riddle  Procedure(s) Performed: COLONOSCOPY  Patient location during evaluation: Endoscopy Anesthesia Type: General Level of consciousness: awake and alert Pain management: pain level controlled Vital Signs Assessment: post-procedure vital signs reviewed and stable Respiratory status: spontaneous breathing, nonlabored ventilation, respiratory function stable and patient connected to nasal cannula oxygen Cardiovascular status: blood pressure returned to baseline and stable Postop Assessment: no apparent nausea or vomiting Anesthetic complications: no   No notable events documented.   Last Vitals:  Vitals:   07/21/21 0957 07/21/21 1007  BP: 134/84   Pulse: 65 65  Resp: (!) 21 (!) 22  Temp:    SpO2: 100% 97%    Last Pain:  Vitals:   07/21/21 1007  TempSrc:   PainSc: 0-No pain                 Corinda Gubler

## 2021-07-21 NOTE — Op Note (Signed)
[facility] Patient Name: Mark Riddle Procedure Date: 07/21/2021 8:11 AM MRN: [id number] Account #: [id number] Date of Birth: 04-10-1966 Admit Type: Outpatient Age: 55 Room: [facility] ENDO ROOM 4 Gender: Male Note Status: Finalized Procedure:             Colonoscopy Indications:           Screening for colorectal malignant neoplasm Providers:             Patriece Archbold B. Maximino Greenland MD, MD Medicines:             Monitored Anesthesia Care Complications:         No immediate complications. Procedure:             Pre-Anesthesia Assessment:                        - ASA Grade Assessment: II - A patient with mild                         systemic disease.                        - Prior to the procedure, a History and Physical was                         performed, and patient medications, allergies and                         sensitivities were reviewed. The patient's tolerance                         of previous anesthesia was reviewed.                        - The risks and benefits of the procedure and the                         sedation options and risks were discussed with the                         patient. All questions were answered and informed                         consent was obtained.                        - Patient identification and proposed procedure were                         verified prior to the procedure by the physician, the                         nurse, the anesthesiologist, the anesthetist and the                         technician. The procedure was verified in the                         procedure room.  After obtaining informed consent, the colonoscope was                         passed under direct vision. Throughout the procedure,                         the patient's blood pressure, pulse, and oxygen                         saturations were monitored continuously. The                         Colonoscope was  introduced through the anus and                         advanced to the the cecum, identified by appendiceal                         orifice and ileocecal valve. The colonoscopy was                         performed with ease. The patient tolerated the                         procedure well. The quality of the bowel preparation                         was fair. Findings:      The perianal and digital rectal examinations were normal.      A 5 mm polyp was found in the cecum. The polyp was flat. The polyp was       removed with a jumbo cold forceps. Resection and retrieval were complete.      Two sessile polyps were found in the sigmoid colon. The polyps were 5 to       6 mm in size. These polyps were removed with a cold snare. Resection and       retrieval were complete.      The exam was otherwise without abnormality.      The rectum, sigmoid colon, descending colon, transverse colon, ascending       colon and cecum appeared normal.      The retroflexed view of the distal rectum and anal verge was normal and       showed no anal or rectal abnormalities. Impression:            - Preparation of the colon was fair.                        - One 5 mm polyp in the cecum, removed with a jumbo                         cold forceps. Resected and retrieved.                        - Two 5 to 6 mm polyps in the sigmoid colon, removed                         with a cold snare. Resected and retrieved.                        -  The examination was otherwise normal.                        - The rectum, sigmoid colon, descending colon,                         transverse colon, ascending colon and cecum are normal.                        - The distal rectum and anal verge are normal on                         retroflexion view. Recommendation:        - Await pathology results.                        - Discharge patient to home (with escort).                        - Return to GI clinic at the next  available                         appointment.                        - Advance diet as tolerated.                        - Continue present medications.                        - Repeat colonoscopy date to be determined after                         pending pathology results are reviewed.                        - The findings and recommendations were discussed with                         the patient.                        - The findings and recommendations were discussed with                         the patient's family.                        - Return to primary care physician as previously                         scheduled. Procedure Code(s):     --- Professional ---                        [id number], Colonoscopy, flexible; with removal of                         tumor(s), polyp(s), or other lesion(s) by snare  technique                        45380, 59, Colonoscopy, flexible; with biopsy, single                         or multiple Diagnosis Code(s):     --- Professional ---                        K63.5, Polyp of colon                        Z12.11, Encounter for screening for malignant neoplasm                         of colon CPT copyright 2019 American Medical Association. All rights reserved. The codes documented in this report are preliminary and upon coder review may  be revised to meet current compliance requirements.  Melodie Bouillon, MD Michel Bickers B. Maximino Greenland MD, MD 07/21/2021 9:32:49 AM This report has been signed electronically. Number of Addenda: 0 Note Initiated On: 07/21/2021 8:11 AM Scope Withdrawal Time: 0 hours 27 minutes 24 seconds  Total Procedure Duration: 0 hours 31 minutes 15 seconds  Estimated Blood Loss:  Estimated blood loss: none.      [facility]

## 2021-07-21 NOTE — H&P (Signed)
Melodie Bouillon, MD [address], [place], [place], [address] [address], [address], [place], [place], [address] Phone: [phone]  Fax: [phone]  Primary Care Physician:  Dortha Kern, MD   Pre-Procedure History & Physical: HPI:  Mark Riddle is a 55 y.o. male is here for a colonoscopy.   Past Medical History:  Diagnosis Date   Anemia     History reviewed. No pertinent surgical history.  Prior to Admission medications   Medication Sig Start Date End Date Taking? Authorizing Provider  omeprazole (PRILOSEC) 40 MG capsule Take by mouth.   Yes [provider]  polyethylene glycol-electrolytes (NULYTELY) 420 g solution Drink one 8 oz glass every 20 mins until entire container is finished staring at 5:00pm on 07/20/21 07/02/21   Pasty Spillers, MD  testosterone cypionate (DEPOTESTOTERONE CYPIONATE) 100 MG/ML injection Inject 100 mg into the muscle every 14 (fourteen) days. For IM use only    [provider]    Allergies as of 07/01/2021   (No Known Allergies)    Family History  Problem Relation Age of Onset   Pulmonary fibrosis Maternal Uncle    Pulmonary fibrosis Paternal Aunt    Diabetes Paternal Grandmother    Cancer Paternal Grandfather        lung    Social History   Socioeconomic History   Marital status: Married    Spouse name: Not on file   Number of children: Not on file   Years of education: Not on file   Highest education level: Not on file  Occupational History   Not on file  Tobacco Use   Smoking status: Former    Packs/day: 1.50    Years: 40.00    Pack years: 60.00    Types: Cigarettes    Quit date: 05/03/2007    Years since quitting: 14.2   Smokeless tobacco: Never  Vaping Use   Vaping Use: Never used  Substance and Sexual Activity   Alcohol use: Yes    Comment: occasional   Drug use: No   Sexual activity: Yes  Other Topics Concern   Not on file  Social History Narrative   Not on file    Social Determinants of Health   Financial Resource Strain: Not on file  Food Insecurity: Not on file  Transportation Needs: Not on file  Physical Activity: Not on file  Stress: Not on file  Social Connections: Not on file  Intimate Partner Violence: Not on file    Review of Systems: See HPI, otherwise negative ROS  Physical Exam: Constitutional: General:   Alert,  Well-developed, well-nourished, pleasant and cooperative in NAD There were no vitals taken for this visit.  Head: Normocephalic, atraumatic.   Eyes:  Sclera clear, no icterus.   Conjunctiva pink.   Mouth:  No deformity or lesions, oropharynx pink & moist.  Neck:  Supple, trachea midline  Respiratory: Normal respiratory effort  Gastrointestinal:  Soft, non-tender and non-distended without masses, hepatosplenomegaly or hernias noted.  No guarding or rebound tenderness.     Cardiac: No clubbing or edema.  No cyanosis. Normal posterior tibial pedal pulses noted.  Lymphatic:  No significant cervical adenopathy.  Psych:  Alert and cooperative. Normal mood and affect.  Musculoskeletal:   Symmetrical without gross deformities. 5/5 Lower extremity strength bilaterally.  Skin: Warm. Intact without significant lesions or rashes. No jaundice.  Neurologic:  Face symmetrical, tongue midline, Normal sensation to touch;  grossly normal neurologically.  Psych:  Alert and oriented  x3, Alert and cooperative. Normal mood and affect.  Impression/Plan: Mark Riddle is here for a colonoscopy to be performed for average risk screening.  Patient's referral states to the referral was placed for family history of colon polyps.  Patient denies any family history of colon cancer itself and reports that brother had polyps.  I was able to find previous colonoscopy report under probation.  Patient had a colonoscopy in 2009 for family history of polyps that was normal, repeat was recommended in 5 years.  Then he had another colonoscopy  in 2015 for rectal bleeding by Dr. Bluford Kaufmann, which did not show any polyps, but reported a fair prep.  He also had an EGD in 2015 for abdominal pain, that showed grade a esophagitis and was otherwise reported to be normal.  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  The procedure report accompanying this pathology report is not available to [place].  I discussed this with the patient and recommended that he follow-up in GI clinic either with his previous GI, or set up an appointment in our clinic to discuss this further and possible repeat EGD for gastric mapping biopsies.  He verbalized understanding.  Risks, benefits, limitations, and alternatives regarding  colonoscopy have been reviewed with the patient.  Questions have been answered.  All parties agreeable.   Pasty Spillers, MD  07/21/2021, 8:13 AM

## 2021-07-22 ENCOUNTER — Encounter: Payer: Self-pay | Admitting: Gastroenterology

## 2021-07-22 LAB — SURGICAL PATHOLOGY

## 2021-07-23 ENCOUNTER — Telehealth: Payer: Self-pay

## 2021-07-23 NOTE — Telephone Encounter (Signed)
-----   Message from Pasty Spillers, MD sent at 07/21/2021 10:15 AM EDT ----- Please make clinic follow up in the next 2-3 months when there is an opening to discuss chronic GERD and previous EGD biopsies from 2016

## 2021-07-23 NOTE — Telephone Encounter (Signed)
Called pt, no answer and not able lmovm... appt scheduled and reminder mailed to pt  Msg sent via mychart

## 2021-09-21 ENCOUNTER — Ambulatory Visit: Payer: BC Managed Care – PPO | Admitting: Gastroenterology

## 2021-09-21 ENCOUNTER — Other Ambulatory Visit: Payer: Self-pay

## 2021-09-21 VITALS — BP 168/100 | HR 66 | Temp 98.2°F | Wt 197.0 lb

## 2021-09-21 DIAGNOSIS — K219 Gastro-esophageal reflux disease without esophagitis: Secondary | ICD-10-CM | POA: Diagnosis not present

## 2021-09-21 DIAGNOSIS — R17 Unspecified jaundice: Secondary | ICD-10-CM

## 2021-09-21 MED ORDER — NA SULFATE-K SULFATE-MG SULF 17.5-3.13-1.6 GM/177ML PO SOLN: 1.0000 | Freq: Once | ORAL | 0 refills | Status: AC

## 2021-09-21 NOTE — Progress Notes (Signed)
Mark Bouillon, MD [address]  [address]  [place], [place] [address]  Main: [phone]  Fax: [phone]   Primary Care Physician: Mark Kern, MD   Chief complaint: Reflux  HPI: Mark Riddle is a 55 y.o. male who was initially seen for screening colonoscopy as an outpatient with subcentimeter polyps removed that showed tubular adenoma and a fair prep.  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  Patient also reports chronic GERD and has been taking omeprazole once a day for years.  States that if he misses his dose, he has a bad day and night with epigastric pain and that symptoms of reflux.  No weight loss.  No blood in stool.  No constipation or diarrhea.  Most recent labs were reassuring with normal hemoglobin.  CMP does show mild elevation in total bilirubin.  Lipase normal at 31  As per H&P on the day of this procedure I was able to find previous colonoscopy report under probation.  Patient had a colonoscopy in 2009 for family history of polyps that was normal, repeat was recommended in 5 years.  Then he had another colonoscopy in 2015 for rectal bleeding by Dr. Bluford Kaufmann, which did not show any polyps, but reported a fair prep.  He also had an EGD in 2015 for abdominal pain, that showed grade a esophagitis and was otherwise reported to be normal.   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  The procedure report accompanying this pathology report is not available to [place].  I discussed this with the patient and recommended that he follow-up in GI clinic either with his previous GI, or set up an appointment in our clinic to discuss this further and possible repeat EGD for gastric mapping biopsies.    ROS: All ROS reviewed and negative except as per HPI   Past Medical History:  Diagnosis Date   Anemia      Past Surgical History:  Procedure Laterality Date   COLONOSCOPY N/A 07/21/2021   Procedure: COLONOSCOPY;  Surgeon: Pasty Spillers, MD;  Location: [facility] ENDOSCOPY;  Service: Endoscopy;  Laterality: N/A;    Prior to Admission medications   Medication Sig Start Date End Date Taking? Authorizing Provider  omeprazole (PRILOSEC) 40 MG capsule Take by mouth.   Yes [provider]  testosterone cypionate (DEPOTESTOTERONE CYPIONATE) 100 MG/ML injection Inject 100 mg into the muscle every 14 (fourteen) days. For IM use only   Yes [provider]    Family History  Problem Relation Age of Onset   Pulmonary fibrosis Maternal Uncle    Pulmonary fibrosis Paternal Aunt    Diabetes Paternal Grandmother    Cancer Paternal Grandfather        lung     Social History   Tobacco Use   Smoking status: Former    Packs/day: 1.50    Years: 40.00    Pack years: 60.00    Types: Cigarettes    Quit date: 05/03/2007    Years since quitting: 14.3   Smokeless tobacco: Never  Vaping Use   Vaping Use: Never used  Substance Use Topics   Alcohol use: Yes    Comment: occasional   Drug use: No    Allergies as of 09/21/2021   (No Known Allergies)    Physical Examination:  Constitutional: General:  Alert,  Well-developed, well-nourished, pleasant and cooperative in NAD BP (!) 168/100   Pulse 66   Temp 98.2 F (36.8 C) (Oral)   Wt 197 lb (89.4 kg)   BMI 29.95 kg/m   Respiratory: Normal respiratory effort  Gastrointestinal:  Soft, non-tender and non-distended without masses, hepatosplenomegaly or hernias noted.  No guarding or rebound tenderness.     Cardiac: No clubbing or edema.  No cyanosis. Normal posterior tibial pedal pulses noted.  Psych:  Alert and cooperative. Normal mood and affect.  Musculoskeletal:  Normal gait. Head normocephalic, atraumatic. Symmetrical without gross deformities. 5/5 Lower extremity strength bilaterally.  Skin: Warm. Intact without  significant lesions or rashes. No jaundice.  Neck: Supple, trachea midline  Lymph: No cervical lymphadenopathy  Psych:  Alert and oriented x3, Alert and cooperative. Normal mood and affect.  Labs: CMP     Component Value Date/Time   NA 134 (L) 07/06/2021 0930   NA 141 10/15/2014 2216   K 4.0 07/06/2021 0930   K 3.8 10/15/2014 2216   CL 104 07/06/2021 0930   CL 108 (H) 10/15/2014 2216   CO2 24 07/06/2021 0930   CO2 25 10/15/2014 2216   GLUCOSE 104 (H) 07/06/2021 0930   GLUCOSE 103 (H) 10/15/2014 2216   BUN 10 07/06/2021 0930   BUN 12 10/15/2014 2216   CREATININE 1.13 07/06/2021 0930   CREATININE 1.01 10/15/2014 2216   CALCIUM 8.3 (L) 07/06/2021 0930   CALCIUM 8.1 (L) 10/15/2014 2216   PROT 6.9 07/06/2021 0930   PROT 7.1 10/15/2014 2216   ALBUMIN 3.7 07/06/2021 0930   ALBUMIN 3.6 10/15/2014 2216   AST 23 07/06/2021 0930   AST 20 10/15/2014 2216   ALT 25 07/06/2021 0930   ALT 24 10/15/2014 2216   ALKPHOS 52 07/06/2021 0930   ALKPHOS 55 10/15/2014 2216   BILITOT 1.4 (H) 07/06/2021 0930   BILITOT 0.6 10/15/2014 2216   GFRNONAA >60 07/06/2021 0930   GFRNONAA >60 10/15/2014 2216   GFRAA >60 05/02/2017 1223   GFRAA >60 10/15/2014 2216   Lab Results  Component Value Date   WBC 7.2 07/06/2021   HGB 16.7 07/06/2021   HCT 49.8 07/06/2021   MCV 89.2 07/06/2021   PLT 191 07/06/2021   Lipase 31  Imaging Studies:   Assessment and Plan:   Mark Riddle is a 55 y.o. y/o male here for evaluation of chronic GERD, history of gastric intestinal metaplasia in 2016  Patient will need EGD for gastric mapping biopsies and also evaluation of Barrett's esophagus, chronic GERD and chronic PPI use  He will also need repeat colonoscopy due to fair prep on last exam  I have discussed alternative options, risks & benefits,  which include, but are not limited to, bleeding, infection, perforation,respiratory complication & drug reaction.  The patient agrees with this plan & written  consent will be obtained.    Patient educated extensively on acid reflux lifestyle modification, including using a bed wedge, not eating 3 hrs before bedtime, diet modifications, and handout given for the same.   (Risks of PPI use were discussed with patient including bone loss, C. Diff diarrhea, pneumonia, infections, CKD, electrolyte abnormalities.  Pt. Verbalizes understanding and chooses to continue the medication.)  Can try to decrease omeprazole to 20 mg once daily after EGD if no indication for higher dose therapy.  Patient states he was initially prescribed on omeprazole 40 mg twice a day but has only always taken 40 mg once a day.  Recent labs also show mild elevation in total bilirubin.  We will fractionate   Dr Mark Riddle

## 2021-09-22 LAB — BILIRUBIN, FRACTIONATED(TOT/DIR/INDIR)
Bilirubin Total: 0.7 mg/dL (ref 0.0–1.2)
Bilirubin, Direct: 0.17 mg/dL (ref 0.00–0.40)
Bilirubin, Indirect: 0.53 mg/dL (ref 0.10–0.80)

## 2021-11-04 ENCOUNTER — Encounter: Payer: Self-pay | Admitting: Gastroenterology

## 2021-11-05 ENCOUNTER — Encounter
Admission: RE | Disposition: A | Discharge status: Home / Self Care | Payer: Self-pay | Source: Ambulatory Visit | Attending: Gastroenterology

## 2021-11-05 ENCOUNTER — Ambulatory Visit: Payer: BC Managed Care – PPO | Admitting: Registered Nurse

## 2021-11-05 ENCOUNTER — Ambulatory Visit
Admission: RE | Admit: 2021-11-05 | Discharge: 2021-11-05 | Disposition: A | Discharge status: Home / Self Care | Payer: BC Managed Care – PPO | Source: Ambulatory Visit | Attending: Gastroenterology | Admitting: Gastroenterology

## 2021-11-05 DIAGNOSIS — K449 Diaphragmatic hernia without obstruction or gangrene: Secondary | ICD-10-CM | POA: Diagnosis not present

## 2021-11-05 DIAGNOSIS — R12 Heartburn: Secondary | ICD-10-CM | POA: Diagnosis not present

## 2021-11-05 DIAGNOSIS — Z87891 Personal history of nicotine dependence: Secondary | ICD-10-CM | POA: Diagnosis not present

## 2021-11-05 DIAGNOSIS — K2289 Other specified disease of esophagus: Secondary | ICD-10-CM

## 2021-11-05 DIAGNOSIS — K635 Polyp of colon: Secondary | ICD-10-CM | POA: Diagnosis not present

## 2021-11-05 DIAGNOSIS — K648 Other hemorrhoids: Secondary | ICD-10-CM | POA: Insufficient documentation

## 2021-11-05 DIAGNOSIS — K219 Gastro-esophageal reflux disease without esophagitis: Secondary | ICD-10-CM | POA: Diagnosis not present

## 2021-11-05 DIAGNOSIS — Z86010 Personal history of colonic polyps: Secondary | ICD-10-CM

## 2021-11-05 DIAGNOSIS — K2270 Barrett's esophagus without dysplasia: Secondary | ICD-10-CM | POA: Insufficient documentation

## 2021-11-05 DIAGNOSIS — K3189 Other diseases of stomach and duodenum: Secondary | ICD-10-CM | POA: Diagnosis not present

## 2021-11-05 DIAGNOSIS — K5730 Diverticulosis of large intestine without perforation or abscess without bleeding: Secondary | ICD-10-CM | POA: Diagnosis not present

## 2021-11-05 DIAGNOSIS — Z1211 Encounter for screening for malignant neoplasm of colon: Secondary | ICD-10-CM | POA: Insufficient documentation

## 2021-11-05 HISTORY — PX: COLONOSCOPY WITH PROPOFOL: SHX5780

## 2021-11-05 HISTORY — PX: ESOPHAGOGASTRODUODENOSCOPY (EGD) WITH PROPOFOL: SHX5813

## 2021-11-05 SURGERY — COLONOSCOPY WITH PROPOFOL
Anesthesia: General

## 2021-11-05 MED ORDER — PROPOFOL 10 MG/ML IV BOLUS
INTRAVENOUS | Status: DC | PRN
  Administered 2021-11-05: 100 mg via INTRAVENOUS
  Administered 2021-11-05: 20 mg via INTRAVENOUS
  Administered 2021-11-05: 30 mg via INTRAVENOUS

## 2021-11-05 MED ORDER — PROPOFOL 500 MG/50ML IV EMUL
INTRAVENOUS | Status: AC
  Filled 2021-11-05: qty 50

## 2021-11-05 MED ORDER — PROPOFOL 500 MG/50ML IV EMUL
INTRAVENOUS | Status: DC | PRN
  Administered 2021-11-05: 150 ug/kg/min via INTRAVENOUS

## 2021-11-05 MED ORDER — SODIUM CHLORIDE 0.9 % IV SOLN
INTRAVENOUS | Status: DC
  Administered 2021-11-05: 20 mL/h via INTRAVENOUS

## 2021-11-05 MED ORDER — STERILE WATER FOR IRRIGATION IR SOLN
Status: DC | PRN
  Administered 2021-11-05: 60 mL

## 2021-11-05 MED ORDER — LIDOCAINE HCL (CARDIAC) PF 100 MG/5ML IV SOSY
PREFILLED_SYRINGE | INTRAVENOUS | Status: DC | PRN
  Administered 2021-11-05: 60 mg via INTRAVENOUS

## 2021-11-05 MED ORDER — OMEPRAZOLE 20 MG PO CPDR: 20.0000 mg | DELAYED_RELEASE_CAPSULE | Freq: Every day | ORAL | 0 refills | Status: AC

## 2021-11-05 NOTE — Op Note (Signed)
[facility] Gastroenterology Patient Name: Mark Riddle Procedure Date: 11/05/2021 10:38 AM MRN: [id number] Account #: [id number] Date of Birth: [birth year] Admit Type: Outpatient Age: 55 Room: [facility] ENDO ROOM 2 Gender: Male Note Status: Finalized Instrument Name: Upper Endoscope [id number] Procedure:             Upper GI endoscopy Indications:           Heartburn, Follow-up of intestinal metaplasia Providers:             Braelynn Lupton B. Maximino Greenland MD, MD Referring MD:          Sallye Lat Md, MD (Referring MD) Medicines:             Monitored Anesthesia Care Complications:         No immediate complications. Procedure:             Pre-Anesthesia Assessment:                        - Prior to the procedure, a History and Physical was                         performed, and patient medications, allergies and                         sensitivities were reviewed. The patient's tolerance                         of previous anesthesia was reviewed.                        - The risks and benefits of the procedure and the                         sedation options and risks were discussed with the                         patient. All questions were answered and informed                         consent was obtained.                        - Patient identification and proposed procedure were                         verified prior to the procedure by the physician, the                         nurse, the anesthesiologist, the anesthetist and the                         technician. The procedure was verified in the                         procedure room.                        - ASA Grade Assessment: II - A patient with mild  systemic disease.                        After obtaining informed consent, the endoscope was                         passed under direct vision. Throughout the procedure,                         the patient's blood pressure, pulse, and oxygen                          saturations were monitored continuously. The Endoscope                         was introduced through the mouth, and advanced to the                         second part of duodenum. The upper GI endoscopy was                         accomplished with ease. The patient tolerated the                         procedure well. Findings:      Tongues of salmon-colored mucosa were present. No other visible       abnormalities were present. The maximum longitudinal extent of these       esophageal mucosal changes was 1 cm in length. Mucosa was biopsied with       a cold forceps for histology. Mucosa was biopsied with a cold forceps       for histology in a targeted manner and in 4 quadrants.      The exam of the esophagus was otherwise normal.      The entire examined stomach was normal. Biopsies were obtained in the       gastric body, at the incisura and in the gastric antrum with cold       forceps for histology. Biopsies were taken with a cold forceps for       histology.      A small hiatal hernia was present.      The duodenal bulb, second portion of the duodenum and examined duodenum       were normal. Impression:            - Salmon-colored mucosa suspicious for short-segment                         Barrett's esophagus. Biopsied.                        - Normal stomach. Biopsied.                        - Small hiatal hernia.                        - Normal duodenal bulb, second portion of the duodenum                         and examined duodenum.                        -  Biopsies were obtained in the gastric body, at the                         incisura and in the gastric antrum. Recommendation:        - Await pathology results.                        - Follow an antireflux regimen.                        - Discharge patient to home (with escort).                        - Advance diet as tolerated.                        - Continue present medications.                         - Patient has a contact number available for                         emergencies. The signs and symptoms of potential                         delayed complications were discussed with the patient.                         Return to normal activities tomorrow. Written                         discharge instructions were provided to the patient.                        - Discharge patient to home (with escort).                        - The findings and recommendations were discussed with                         the patient.                        - The findings and recommendations were discussed with                         the patient's family. Procedure Code(s):     --- Professional ---                        [phone], Esophagogastroduodenoscopy, flexible,                         transoral; with biopsy, single or multiple Diagnosis Code(s):     --- Professional ---                        K22.8, Other specified diseases of esophagus                        K44.9, Diaphragmatic hernia without obstruction or  gangrene                        R12, Heartburn                        K31.89, Other diseases of stomach and duodenum CPT copyright 2019 American Medical Association. All rights reserved. The codes documented in this report are preliminary and upon coder review may  be revised to meet current compliance requirements.  Melodie Bouillon, MD Michel Bickers B. Maximino Greenland MD, MD 11/05/2021 11:02:45 AM This report has been signed electronically. Number of Addenda: 0 Note Initiated On: 11/05/2021 10:38 AM Estimated Blood Loss:  Estimated blood loss: none.      [facility]

## 2021-11-05 NOTE — Anesthesia Postprocedure Evaluation (Signed)
Anesthesia Post Note  Patient: Mark Riddle  Procedure(s) Performed: COLONOSCOPY WITH PROPOFOL ESOPHAGOGASTRODUODENOSCOPY (EGD) WITH PROPOFOL  Patient location during evaluation: PACU Anesthesia Type: General Level of consciousness: awake and awake and alert Pain management: pain level controlled Vital Signs Assessment: post-procedure vital signs reviewed and stable Respiratory status: spontaneous breathing and nonlabored ventilation Cardiovascular status: stable Anesthetic complications: no   No notable events documented.   Last Vitals:  Vitals:   11/05/21 1136 11/05/21 1137  BP: (!) 104/54 (!) 104/54  Pulse: 76 75  Resp: 15 12  Temp: (!) 35.9 C   SpO2: 94% 96%    Last Pain:  Vitals:   11/05/21 1137  TempSrc:   PainSc: Asleep                 VAN STAVEREN,Kawehi Hostetter

## 2021-11-05 NOTE — Anesthesia Preprocedure Evaluation (Signed)
Anesthesia Evaluation  Patient identified by MRN, date of birth, ID band Patient awake    Reviewed: Allergy & Precautions, NPO status , Patient's Chart, lab work & pertinent test results  Airway Mallampati: II  TM Distance: >3 FB Neck ROM: Full    Dental  (+) Teeth Intact   Pulmonary neg pulmonary ROS, COPD, former smoker,    Pulmonary exam normal  + decreased breath sounds      Cardiovascular Exercise Tolerance: Good negative cardio ROS Normal cardiovascular exam Rhythm:Regular     Neuro/Psych negative neurological ROS  negative psych ROS   GI/Hepatic negative GI ROS, Neg liver ROS,   Endo/Other  negative endocrine ROS  Renal/GU negative Renal ROS  negative genitourinary   Musculoskeletal negative musculoskeletal ROS (+)   Abdominal Normal abdominal exam  (+)   Peds negative pediatric ROS (+)  Hematology negative hematology ROS (+) Blood dyscrasia, anemia ,   Anesthesia Other Findings Past Medical History: No date: Anemia  Past Surgical History: 07/21/2021: COLONOSCOPY; N/A     Comment:  Procedure: COLONOSCOPY;  Surgeon: Pasty Spillers,               MD;  Location: [facility] ENDOSCOPY;  Service: Endoscopy;                Laterality: N/A;     Reproductive/Obstetrics negative OB ROS                             Anesthesia Physical Anesthesia Plan  ASA: 2  Anesthesia Plan: General   Post-op Pain Management:    Induction: Intravenous  PONV Risk Score and Plan: Propofol infusion and TIVA  Airway Management Planned: Natural Airway and Nasal Cannula  Additional Equipment:   Intra-op Plan:   Post-operative Plan:   Informed Consent: I have reviewed the patients History and Physical, chart, labs and discussed the procedure including the risks, benefits and alternatives for the proposed anesthesia with the patient or authorized representative who has indicated his/her  understanding and acceptance.     Dental Advisory Given  Plan Discussed with: CRNA and Surgeon  Anesthesia Plan Comments:         Anesthesia Quick Evaluation

## 2021-11-05 NOTE — Op Note (Signed)
[facility] Gastroenterology Patient Name: Mark Riddle Procedure Date: 11/05/2021 10:37 AM MRN: [id number] Account #: [id number] Date of Birth: 08-02-1966 Admit Type: Outpatient Age: 55 Room: [facility] ENDO ROOM 2 Gender: Male Note Status: Finalized Instrument Name: Prentice Docker [id number] Procedure:             Colonoscopy Indications:           High risk colon cancer surveillance: Personal history                         of colonic polyps Providers:             Yaritsa Savarino B. Maximino Greenland MD, MD Referring MD:          Sallye Lat Md, MD (Referring MD) Medicines:             Monitored Anesthesia Care Complications:         No immediate complications. Procedure:             Pre-Anesthesia Assessment:                        - ASA Grade Assessment: II - A patient with mild                         systemic disease.                        - Prior to the procedure, a History and Physical was                         performed, and patient medications, allergies and                         sensitivities were reviewed. The patient's tolerance                         of previous anesthesia was reviewed.                        - The risks and benefits of the procedure and the                         sedation options and risks were discussed with the                         patient. All questions were answered and informed                         consent was obtained.                        - Patient identification and proposed procedure were                         verified prior to the procedure by the physician, the                         nurse, the anesthesiologist, the anesthetist and the  technician. The procedure was verified in the                         procedure room.                        After obtaining informed consent, the colonoscope was                         passed under direct vision. Throughout the procedure,                         the  patient's blood pressure, pulse, and oxygen                         saturations were monitored continuously. The                         Colonoscope was introduced through the anus and                         advanced to the the cecum, identified by appendiceal                         orifice and ileocecal valve. The colonoscopy was                         performed with ease. The patient tolerated the                         procedure well. The quality of the bowel preparation                         was good. Findings:      The perianal and digital rectal examinations were normal.      A 5 mm polyp was found in the sigmoid colon. The polyp was sessile. The       polyp was removed with a jumbo cold forceps. Resection and retrieval       were complete.      Multiple diverticula were found in the sigmoid colon.      The exam was otherwise without abnormality.      The rectum, sigmoid colon, descending colon, transverse colon, ascending       colon and cecum appeared normal.      Non-bleeding internal hemorrhoids were found during retroflexion and       during endoscopy. Impression:            - One 5 mm polyp in the sigmoid colon, removed with a                         jumbo cold forceps. Resected and retrieved.                        - Diverticulosis in the sigmoid colon.                        - The examination was otherwise normal.                        -  The rectum, sigmoid colon, descending colon,                         transverse colon, ascending colon and cecum are normal.                        - Non-bleeding internal hemorrhoids. Recommendation:        - Discharge patient to home (with escort).                        - High fiber diet.                        - Advance diet as tolerated.                        - Continue present medications.                        - Await pathology results.                        - Repeat colonoscopy date to be determined after                          pending pathology results are reviewed.                        - The findings and recommendations were discussed with                         the patient.                        - The findings and recommendations were discussed with                         the patient's family.                        - Return to primary care physician as previously                         scheduled. Procedure Code(s):     --- Professional ---                        [phone], Colonoscopy, flexible; with biopsy, single or                         multiple Diagnosis Code(s):     --- Professional ---                        Z86.010, Personal history of colonic polyps                        K63.5, Polyp of colon CPT copyright 2019 American Medical Association. All rights reserved. The codes documented in this report are preliminary and upon coder review may  be revised to meet current compliance requirements.  Melodie Bouillon, MD Michel Bickers B. Maximino Greenland MD, MD 11/05/2021 11:35:23 AM This report has been signed electronically. Number of  Addenda: 0 Note Initiated On: 11/05/2021 10:37 AM Scope Withdrawal Time: 0 hours 22 minutes 17 seconds  Total Procedure Duration: 0 hours 25 minutes 42 seconds  Estimated Blood Loss:  Estimated blood loss: none.      [facility]

## 2021-11-05 NOTE — Transfer of Care (Signed)
Immediate Anesthesia Transfer of Care Note  Patient: Mark Riddle  Procedure(s) Performed: COLONOSCOPY WITH PROPOFOL ESOPHAGOGASTRODUODENOSCOPY (EGD) WITH PROPOFOL  Patient Location: Endoscopy Unit  Anesthesia Type:General  Level of Consciousness: drowsy  Airway & Oxygen Therapy: Patient Spontanous Breathing  Post-op Assessment: Report given to RN and Post -op Vital signs reviewed and stable  Post vital signs: Reviewed and stable  Last Vitals:  Vitals Value Taken Time  BP 104/54 11/05/21 1137  Temp    Pulse 75 11/05/21 1137  Resp 12 11/05/21 1137  SpO2 96 % 11/05/21 1137    Last Pain:  Vitals:   11/05/21 0953  TempSrc: Temporal         Complications: No notable events documented.

## 2021-11-05 NOTE — H&P (Signed)
Melodie Bouillon, MD [address], [address], [place], [place], [address] [address], [address], [place], [place], [address] Phone: [phone]  Fax: [phone]  Primary Care Physician:  Dortha Kern, MD   Pre-Procedure History & Physical: HPI:  Mark Riddle is a 55 y.o. male is here for a colonoscopy and EGD.   Past Medical History:  Diagnosis Date   Anemia     Past Surgical History:  Procedure Laterality Date   COLONOSCOPY N/A 07/21/2021   Procedure: COLONOSCOPY;  Surgeon: Pasty Spillers, MD;  Location: [facility] ENDOSCOPY;  Service: Endoscopy;  Laterality: N/A;    Prior to Admission medications   Medication Sig Start Date End Date Taking? Authorizing Provider  omeprazole (PRILOSEC) 40 MG capsule Take by mouth.   Yes [provider]  testosterone cypionate (DEPOTESTOTERONE CYPIONATE) 100 MG/ML injection Inject 100 mg into the muscle every 14 (fourteen) days. For IM use only   Yes [provider]    Allergies as of 09/22/2021   (No Known Allergies)    Family History  Problem Relation Age of Onset   Pulmonary fibrosis Maternal Uncle    Pulmonary fibrosis Paternal Aunt    Diabetes Paternal Grandmother    Cancer Paternal Grandfather        lung    Social History   Socioeconomic History   Marital status: Married    Spouse name: Not on file   Number of children: Not on file   Years of education: Not on file   Highest education level: Not on file  Occupational History   Not on file  Tobacco Use   Smoking status: Former    Packs/day: 1.50    Years: 40.00    Pack years: 60.00    Types: Cigarettes    Quit date: 05/03/2007    Years since quitting: 14.5   Smokeless tobacco: Never  Vaping Use   Vaping Use: Never used  Substance and Sexual Activity   Alcohol use: Yes    Comment: occasional   Drug use: No   Sexual activity: Yes  Other Topics Concern   Not on file  Social History Narrative   Not on file   Social Determinants of  Health   Financial Resource Strain: Not on file  Food Insecurity: Not on file  Transportation Needs: Not on file  Physical Activity: Not on file  Stress: Not on file  Social Connections: Not on file  Intimate Partner Violence: Not on file    Review of Systems: See HPI, otherwise negative ROS  Constitutional: General:   Alert,  Well-developed, well-nourished, pleasant and cooperative in NAD BP 133/90   Pulse 78   Temp 97.9 F (36.6 C) (Temporal)   Resp 20   SpO2 98%   Head: Normocephalic, atraumatic.   Eyes:  Sclera clear, no icterus.   Conjunctiva pink.   Mouth:  No deformity or lesions, oropharynx pink & moist.  Neck:  Supple, trachea midline  Respiratory: Normal respiratory effort  Gastrointestinal:  Soft, non-tender and non-distended without masses, hepatosplenomegaly or hernias noted.  No guarding or rebound tenderness.     Cardiac: No clubbing or edema.  No cyanosis. Normal posterior tibial pedal pulses noted.  Lymphatic:  No significant cervical adenopathy.  Psych:  Alert and cooperative. Normal mood and affect.  Musculoskeletal:   Symmetrical without gross deformities. 5/5 Lower extremity strength bilaterally.  Skin: Warm. Intact without significant lesions or rashes. No jaundice.  Neurologic:  Face symmetrical, tongue midline, Normal sensation to  touch;  grossly normal neurologically.  Psych:  Alert and oriented x3, Alert and cooperative. Normal mood and affect.  Impression/Plan: Mark Riddle is here for a colonoscopy to be performed for history of adenoma polyps and EGD for Acid Reflux.  Risks, benefits, limitations, and alternatives regarding the procedures have been reviewed with the patient.  Questions have been answered.  All parties agreeable.   Pasty Spillers, MD  11/05/2021, 10:38 AM

## 2021-11-08 ENCOUNTER — Encounter: Payer: Self-pay | Admitting: Gastroenterology

## 2021-11-08 LAB — SURGICAL PATHOLOGY

## 2021-11-09 ENCOUNTER — Encounter: Payer: Self-pay | Admitting: Gastroenterology

## 2022-02-10 ENCOUNTER — Other Ambulatory Visit: Payer: Self-pay | Admitting: *Deleted

## 2022-02-10 ENCOUNTER — Telehealth: Payer: Self-pay | Admitting: *Deleted

## 2022-02-10 DIAGNOSIS — Z87891 Personal history of nicotine dependence: Secondary | ICD-10-CM

## 2022-02-10 NOTE — Telephone Encounter (Signed)
Left message for pt to call to schedule f/u lung screening CT scan.  °

## 2022-02-22 ENCOUNTER — Other Ambulatory Visit: Payer: Self-pay

## 2022-02-22 ENCOUNTER — Ambulatory Visit
Admission: RE | Admit: 2022-02-22 | Discharge: 2022-02-22 | Disposition: A | Discharge status: Home / Self Care | Payer: BC Managed Care – PPO | Source: Ambulatory Visit | Attending: Acute Care | Admitting: Acute Care

## 2022-02-22 DIAGNOSIS — Z87891 Personal history of nicotine dependence: Secondary | ICD-10-CM | POA: Diagnosis present

## 2022-02-25 ENCOUNTER — Other Ambulatory Visit: Payer: Self-pay

## 2022-02-25 DIAGNOSIS — Z87891 Personal history of nicotine dependence: Secondary | ICD-10-CM

## 2022-10-22 ENCOUNTER — Other Ambulatory Visit: Payer: Self-pay

## 2022-10-22 ENCOUNTER — Emergency Department
Admission: EM | Admit: 2022-10-22 | Discharge: 2022-10-22 | Disposition: A | Discharge status: Home / Self Care | Payer: BC Managed Care – PPO | Attending: Emergency Medicine | Admitting: Emergency Medicine

## 2022-10-22 ENCOUNTER — Emergency Department: Payer: BC Managed Care – PPO

## 2022-10-22 DIAGNOSIS — J441 Chronic obstructive pulmonary disease with (acute) exacerbation: Secondary | ICD-10-CM | POA: Diagnosis not present

## 2022-10-22 DIAGNOSIS — Z87891 Personal history of nicotine dependence: Secondary | ICD-10-CM | POA: Insufficient documentation

## 2022-10-22 DIAGNOSIS — J209 Acute bronchitis, unspecified: Secondary | ICD-10-CM | POA: Insufficient documentation

## 2022-10-22 DIAGNOSIS — R059 Cough, unspecified: Secondary | ICD-10-CM | POA: Diagnosis present

## 2022-10-22 MED ORDER — PREDNISONE 10 MG (21) PO TBPK: ORAL_TABLET | ORAL | 0 refills | Status: AC

## 2022-10-22 MED ORDER — ALBUTEROL SULFATE HFA 108 (90 BASE) MCG/ACT IN AERS
2.0000 | INHALATION_SPRAY | Freq: Four times a day (QID) | RESPIRATORY_TRACT | 2 refills | Status: AC | PRN
Start: 2022-10-22 — End: ?

## 2022-10-22 MED ORDER — AMOXICILLIN 875 MG PO TABS: 875.0000 mg | ORAL_TABLET | Freq: Two times a day (BID) | ORAL | 0 refills | Status: AC

## 2022-10-22 MED ORDER — PSEUDOEPH-BROMPHEN-DM 30-2-10 MG/5ML PO SYRP: 5.0000 mL | ORAL_SOLUTION | Freq: Four times a day (QID) | ORAL | 0 refills | Status: AC | PRN

## 2022-10-22 NOTE — ED Triage Notes (Signed)
Pt reports has had a constant cough for a couple of months and states that now it is productive and painful. Denies fevers.

## 2022-10-22 NOTE — ED Provider Notes (Signed)
[facility] Provider Note    Event Date/Time   First MD Initiated Contact with Patient 10/22/22 1604     (approximate)   History   Cough   HPI  KYI ROMANELLO is a 56 y.o. male with history of COPD, emphysema, previous smoker of 2 packs a day for about 30 years and has been a non-smoker for 17 years.  Presents emergency department complaining of a cough for approximately 2 to 3 months.  States it is worse at night especially with the sinus drainage.  Has taken multiple over-the-counter medications without any relief.  He denies fever or chills.  Got concerned as this morning he is coughed up streaks of blood.  There were no chunks of blood streaks.      Physical Exam   Triage Vital Signs: ED Triage Vitals  Enc Vitals Group     BP 10/22/22 1420 (!) 158/89     Pulse Rate 10/22/22 1420 69     Resp 10/22/22 1420 20     Temp 10/22/22 1420 98.5 F (36.9 C)     Temp Source 10/22/22 1420 Oral     SpO2 10/22/22 1420 96 %     Weight 10/22/22 1418 195 lb (88.5 kg)     Height 10/22/22 1418 5\' 8"  (1.727 m)     Head Circumference --      Peak Flow --      Pain Score 10/22/22 1418 5     Pain Loc --      Pain Edu? --      Excl. in GC? --     Most recent vital signs: Vitals:   10/22/22 1420  BP: (!) 158/89  Pulse: 69  Resp: 20  Temp: 98.5 F (36.9 C)  SpO2: 96%     General: Awake, no distress.   CV:  Good peripheral perfusion. regular rate and  rhythm Resp:  Normal effort. Lungs CTA, cough is dry and hacking Abd:  No distention.   Other:      ED Results / Procedures / Treatments   Labs (all labs ordered are listed, but only abnormal results are displayed) Labs Reviewed - No data to display   EKG     RADIOLOGY Chest x-ray    PROCEDURES:   Procedures   MEDICATIONS ORDERED IN ED: Medications - No data to display   IMPRESSION / MDM / ASSESSMENT AND PLAN / ED COURSE  I reviewed the triage vital signs and the nursing notes.                               Differential diagnosis includes, but is not limited to, acute exasperation of COPD, chronic bronchitis, CAP, lung cancer  Patient's presentation is most consistent with acute complicated illness / injury requiring diagnostic workup.   Chest x-ray independently reviewed and interpreted by me as being negative for any acute abnormality  In review of the old charts patient did have a CT of his chest done this year for screening purposes.  There is no noted lung cancer noted on this report.  I did explain all the findings to the patient.  He was given a prescription for amoxicillin, Sterapred, albuterol inhaler, and Bromfed cough syrup.  He is to follow-up with his regular doctor and follow-up with a pulmonologist.  He is in agreement with this treatment plan.  He was discharged stable condition.  FINAL CLINICAL IMPRESSION(S) / ED DIAGNOSES   Final diagnoses:  Acute bronchitis, unspecified organism  Chronic obstructive pulmonary disease with acute exacerbation ([place])     Rx / DC Orders   ED Discharge Orders          Ordered    amoxicillin (AMOXIL) 875 MG tablet  2 times daily        10/22/22 1605    predniSONE (STERAPRED UNI-PAK 21 TAB) 10 MG (21) TBPK tablet        10/22/22 1605    albuterol (VENTOLIN HFA) 108 (90 Base) MCG/ACT inhaler  Every 6 hours PRN        10/22/22 1605    brompheniramine-pseudoephedrine-DM 30-2-10 MG/5ML syrup  4 times daily PRN        10/22/22 1605             Note:  This document was prepared using Dragon voice recognition software and may include unintentional dictation errors.    Versie Starks, PA-C 10/22/22 1618    Delman Kitten, MD 10/22/22 [phone]

## 2022-10-22 NOTE — ED Provider Triage Note (Signed)
Emergency Medicine Provider Triage Evaluation Note  Mark Riddle , a 56 y.o. male  was evaluated in triage.  Pt complains of cough x2 to 3 months, previous 2 pack-a-day smoker for 30 years, quit 17 years ago.  Review of Systems  Positive:  Negative:   Physical Exam  BP (!) 158/89 (BP Location: Left Arm)   Pulse 69   Temp 98.5 F (36.9 C) (Oral)   Resp 20   Ht 5\' 8"  (1.727 m)   Wt 88.5 kg   SpO2 96%   BMI 29.65 kg/m  Gen:   Awake, no distress   Resp:  Normal effort  MSK:   Moves extremities without difficulty  Other:    Medical Decision Making  Medically screening exam initiated at 2:25 PM.  Appropriate orders placed.  was informed that the remainder of the evaluation will be completed by another provider, this initial triage assessment does not replace that evaluation, and the importance of remaining in the ED until their evaluation is complete.     Lilli Light, PA-C 10/22/22 1425

## 2023-02-21 ENCOUNTER — Encounter: Payer: Self-pay | Admitting: Oncology

## 2023-02-23 ENCOUNTER — Ambulatory Visit
Admission: RE | Admit: 2023-02-23 | Discharge: 2023-02-23 | Disposition: A | Discharge status: Home / Self Care | Payer: Self-pay | Source: Ambulatory Visit | Attending: Acute Care | Admitting: Acute Care

## 2023-02-23 ENCOUNTER — Ambulatory Visit: Payer: BC Managed Care – PPO

## 2023-02-23 DIAGNOSIS — Z87891 Personal history of nicotine dependence: Secondary | ICD-10-CM | POA: Insufficient documentation

## 2023-10-16 IMAGING — CT CT CHEST LUNG CANCER SCREENING LOW DOSE W/O CM
2 of 5 series · 15 of 40 positions shown, 18 images · non-contrast
Comparison: Low-dose lung cancer screening chest CT 10/27/2020.

CLINICAL DATA: 55-year-old male former smoker (quit 14 years ago)
with 60 pack-year history of smoking. Lung cancer screening
examination.



[Series 3: lung 1.00 · axial · 0.68mm/px · z∈[-1241,-917]mm · 12 of 358 slices shown, 15 images]
[im 17/358  mediastinal]
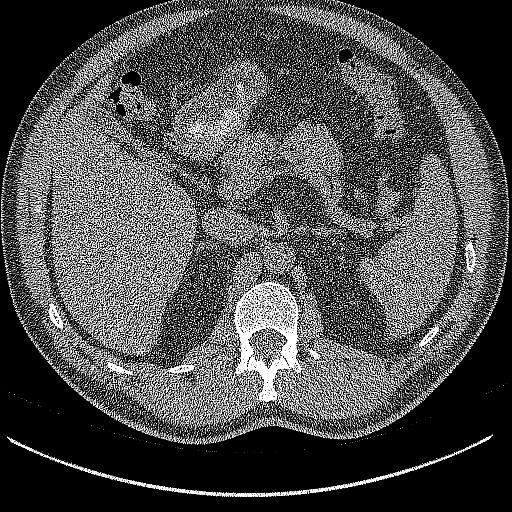
[im 17/358  lung]
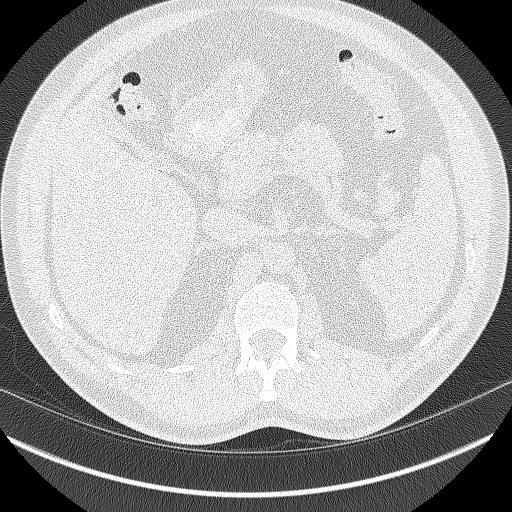
[im 49/358  lung]
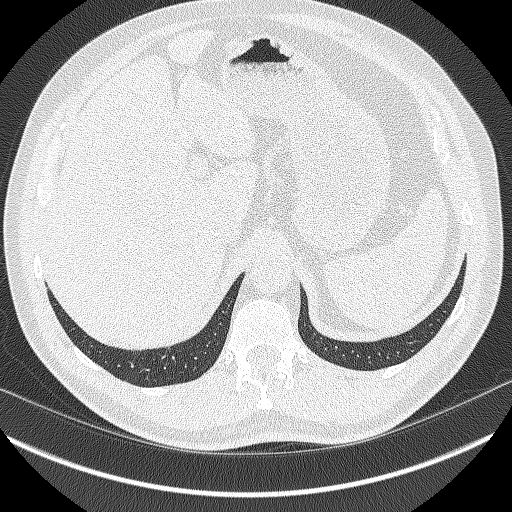
[im 82/358  lung]
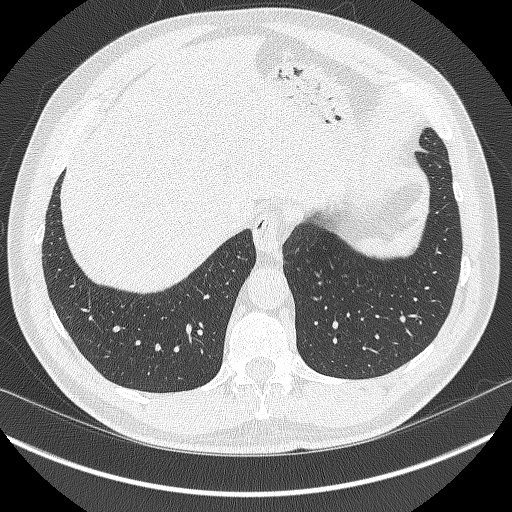
[im 114/358  lung]
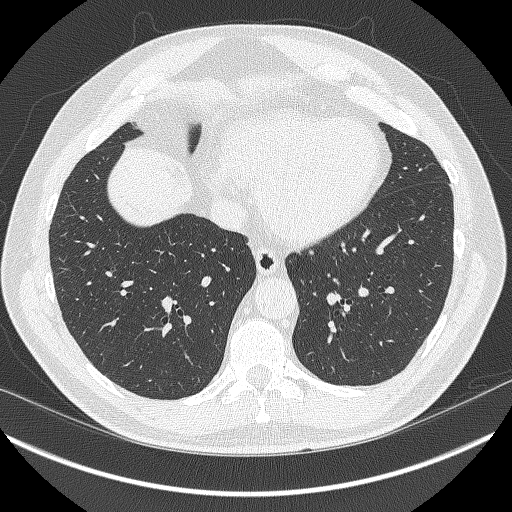
[im 130/358  mediastinal]
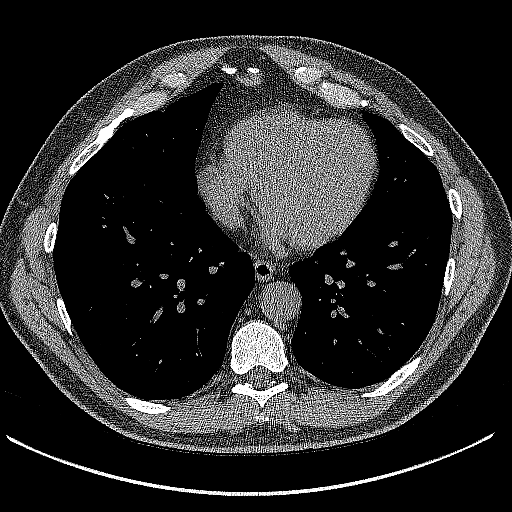
[im 130/358  lung]
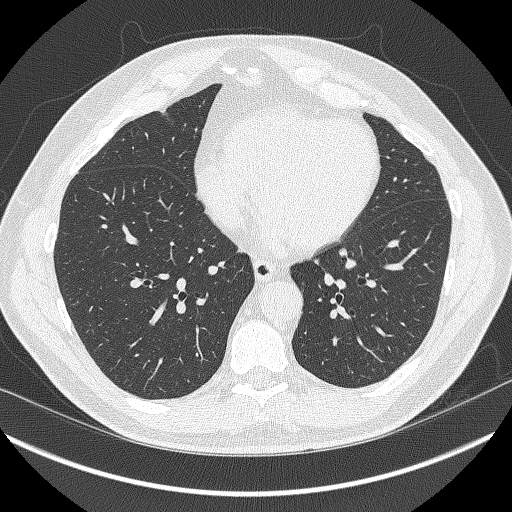
[im 163/358  lung]
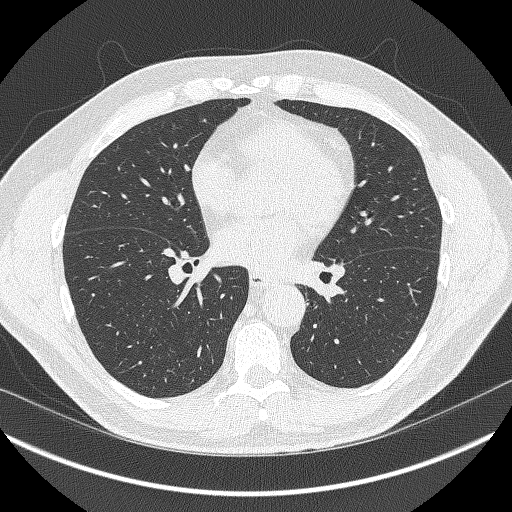
[im 195/358  lung]
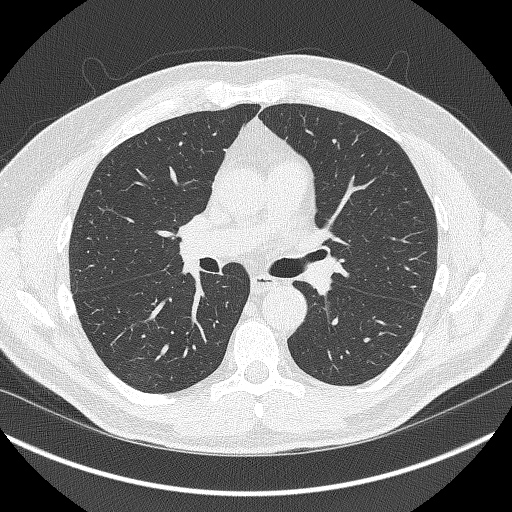
[im 228/358  lung]
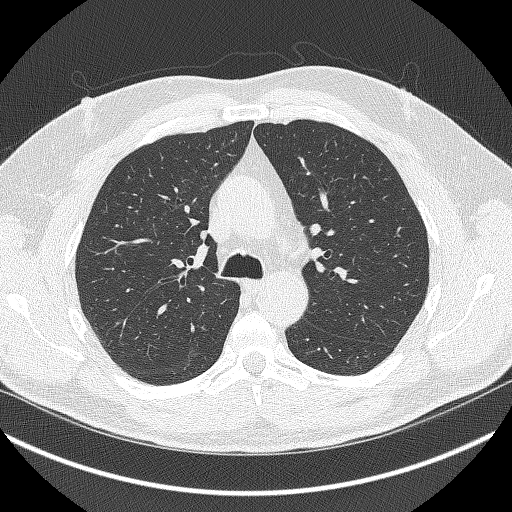
[im 244/358  mediastinal]
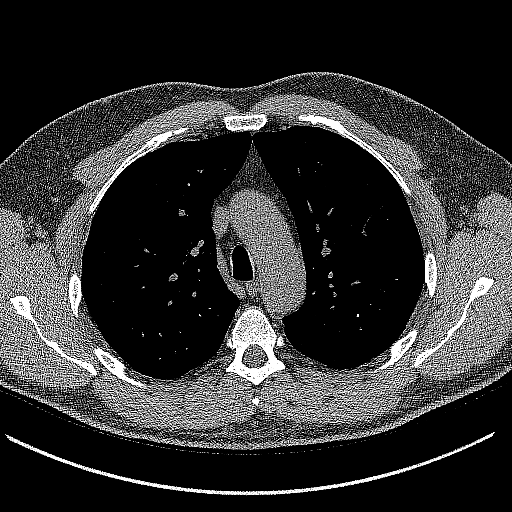
[im 244/358  lung]
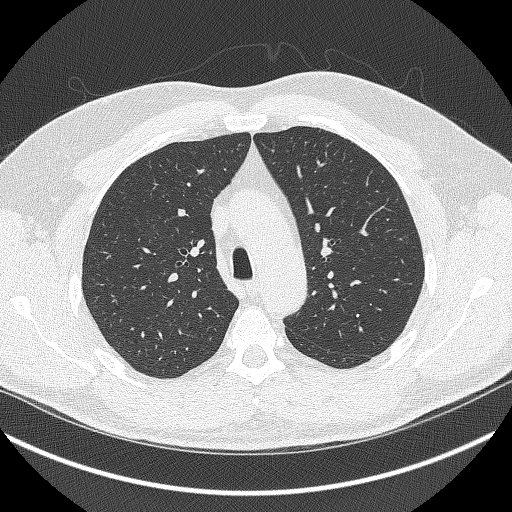
[im 276/358  lung]
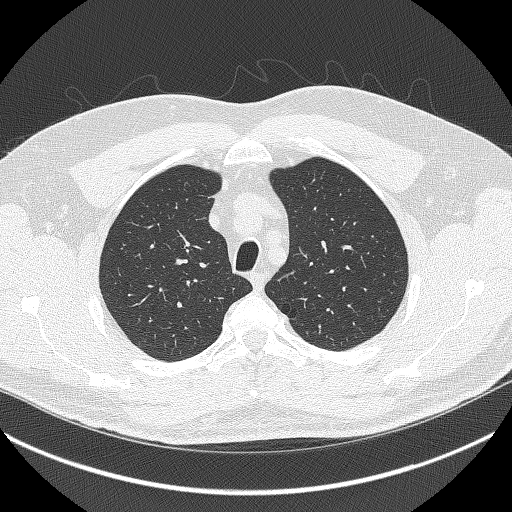
[im 309/358  lung]
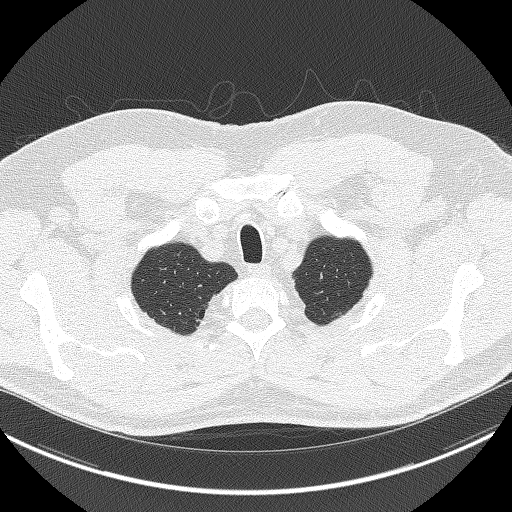
[im 341/358  lung]
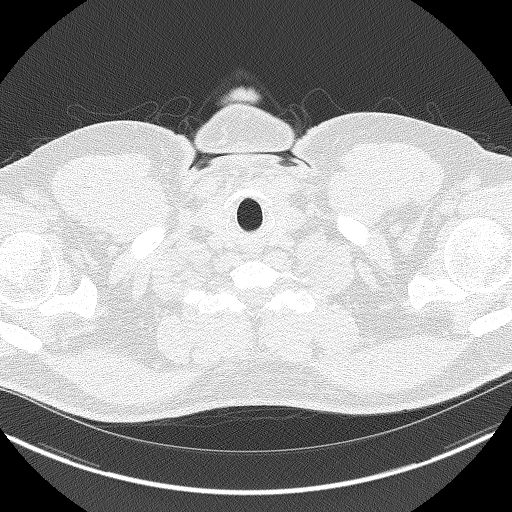

[Series 5: coronals lung 1.00 cor · coronal · 0.68mm/px · 3 of 349 slices shown]
[im 70/349  lung]
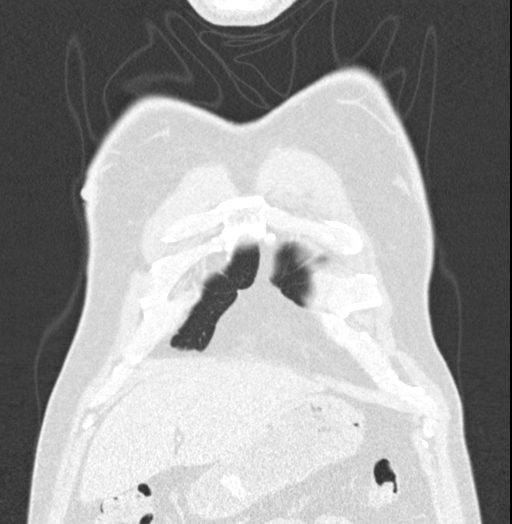
[im 140/349  lung]
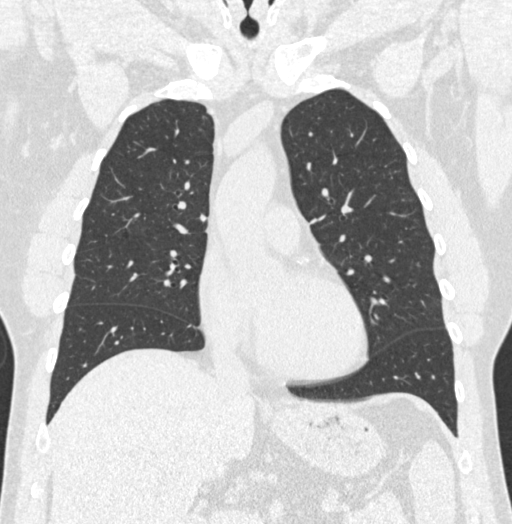
[im 209/349  lung]
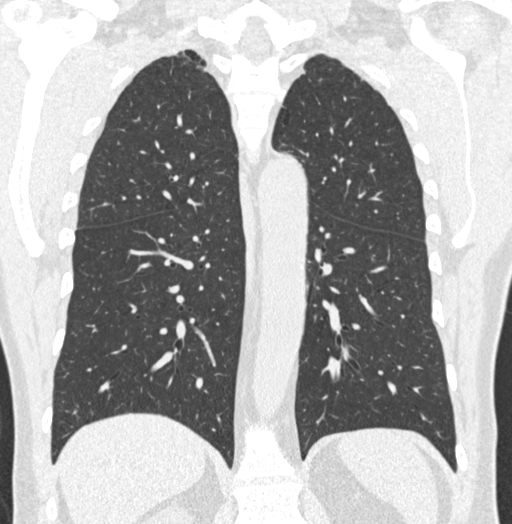

[15 of 40 positions shown; findings below may reference images not displayed]

FINDINGS: Cardiovascular: Heart size is normal. There is no significant
pericardial fluid, thickening or pericardial calcification. There is
aortic atherosclerosis, as well as atherosclerosis of the great
vessels of the mediastinum and the coronary arteries, including
calcified atherosclerotic plaque in the left main, left anterior
descending and left circumflex coronary arteries.

Mediastinum/Nodes: No pathologically enlarged mediastinal or hilar
lymph nodes. Esophagus is unremarkable in appearance. No axillary
lymphadenopathy.

Lungs/Pleura: Multiple small pulmonary nodules are noted in the
lungs bilaterally, largest of which is in the superior segment of
the left lower lobe (axial image 109 of series 3), with a volume
derived mean diameter of 3.6 mm. No larger more suspicious appearing
pulmonary nodules or masses are noted. No acute consolidative
airspace disease. No pleural effusions. Mild diffuse bronchial wall
thickening with very mild centrilobular and mild paraseptal
emphysema.

Upper Abdomen: Aortic atherosclerosis.



Aortic Atherosclerosis (KX72A-1M3.3) and Emphysema (KX72A-BTE.Y).
